# Patient Record
Sex: Female | Born: 1985 | Race: Black or African American | Hispanic: No | Marital: Single
Health system: Southern US, Community
[De-identification: ages and names within clinical notes are randomized; demographics above are authoritative.]

## PROBLEM LIST (undated history)

## (undated) DIAGNOSIS — Z789 Other specified health status: Secondary | ICD-10-CM

## (undated) HISTORY — PX: NO PAST SURGERIES: SHX2092

---

## 2010-07-25 ENCOUNTER — Emergency Department (HOSPITAL_COMMUNITY): Admission: EM | Admit: 2010-07-25 | Discharge: 2010-07-25 | Payer: Self-pay | Admitting: Emergency Medicine

## 2010-11-27 LAB — URINALYSIS, ROUTINE W REFLEX MICROSCOPIC
Bilirubin Urine: NEGATIVE
Nitrite: NEGATIVE
Specific Gravity, Urine: 1.025 (ref 1.005–1.030)
Urobilinogen, UA: 0.2 mg/dL (ref 0.0–1.0)

## 2010-11-27 LAB — URINE MICROSCOPIC-ADD ON

## 2010-11-27 LAB — PREGNANCY, URINE: Preg Test, Ur: NEGATIVE

## 2011-11-12 ENCOUNTER — Ambulatory Visit (INDEPENDENT_AMBULATORY_CARE_PROVIDER_SITE_OTHER): Payer: Private Health Insurance - Indemnity | Admitting: Obstetrics and Gynecology

## 2011-11-12 DIAGNOSIS — Z01419 Encounter for gynecological examination (general) (routine) without abnormal findings: Secondary | ICD-10-CM

## 2012-10-07 ENCOUNTER — Other Ambulatory Visit: Payer: Self-pay

## 2012-10-07 MED ORDER — NORETHINDRONE ACET-ETHINYL EST 1.5-30 MG-MCG PO TABS: 1.0000 | ORAL_TABLET | Freq: Every day | ORAL | Status: AC

## 2018-07-22 LAB — OB RESULTS CONSOLE ABO/RH: RH Type: NEGATIVE

## 2018-07-22 LAB — OB RESULTS CONSOLE RUBELLA ANTIBODY, IGM: Rubella: IMMUNE

## 2018-07-22 LAB — OB RESULTS CONSOLE RPR: RPR: NONREACTIVE

## 2018-07-22 LAB — OB RESULTS CONSOLE GC/CHLAMYDIA
Chlamydia: NEGATIVE
Gonorrhea: NEGATIVE

## 2018-07-22 LAB — OB RESULTS CONSOLE HEPATITIS B SURFACE ANTIGEN: Hepatitis B Surface Ag: NEGATIVE

## 2018-07-22 LAB — OB RESULTS CONSOLE HIV ANTIBODY (ROUTINE TESTING): HIV: NONREACTIVE

## 2018-09-16 NOTE — L&D Delivery Note (Signed)
Delivery Note At 12:49 PM a viable and healthy female was delivered via Vaginal, Spontaneous (Presentation: LOA ).  APGAR: 9, 9; weight 4 lb 9.7 oz (2090 g).   Placenta status: spontaneous, intact.  Cord:  with the following complications: none.  Cord pH: na  Anesthesia:  epidural Episiotomy: None Lacerations: 2nd degree;Perineal Suture Repair: 3.0 vicryl rapide Est. Blood Loss (mL): 182  Mom to postpartum.  Baby to Couplet care / Skin to Skin and then to NICU due to GA.  Darrien Laakso J 01/15/2019, 1:58 PM

## 2018-12-28 ENCOUNTER — Inpatient Hospital Stay (HOSPITAL_COMMUNITY): Payer: 59

## 2018-12-28 ENCOUNTER — Other Ambulatory Visit: Payer: Self-pay

## 2018-12-28 ENCOUNTER — Encounter (HOSPITAL_COMMUNITY): Payer: Self-pay

## 2018-12-28 ENCOUNTER — Inpatient Hospital Stay (HOSPITAL_COMMUNITY)
Admission: AD | Admit: 2018-12-28 | Discharge: 2019-01-17 | DRG: 807 | Disposition: A | Discharge status: Home / Self Care | Payer: 59 | Attending: Obstetrics and Gynecology | Admitting: Obstetrics and Gynecology

## 2018-12-28 DIAGNOSIS — O42919 Preterm premature rupture of membranes, unspecified as to length of time between rupture and onset of labor, unspecified trimester: Secondary | ICD-10-CM

## 2018-12-28 DIAGNOSIS — Z3A31 31 weeks gestation of pregnancy: Secondary | ICD-10-CM

## 2018-12-28 DIAGNOSIS — Z3A33 33 weeks gestation of pregnancy: Secondary | ICD-10-CM | POA: Diagnosis not present

## 2018-12-28 DIAGNOSIS — O42913 Preterm premature rupture of membranes, unspecified as to length of time between rupture and onset of labor, third trimester: Secondary | ICD-10-CM

## 2018-12-28 DIAGNOSIS — O4290 Premature rupture of membranes, unspecified as to length of time between rupture and onset of labor, unspecified weeks of gestation: Secondary | ICD-10-CM | POA: Diagnosis not present

## 2018-12-28 DIAGNOSIS — Z363 Encounter for antenatal screening for malformations: Secondary | ICD-10-CM

## 2018-12-28 DIAGNOSIS — Z3A32 32 weeks gestation of pregnancy: Secondary | ICD-10-CM | POA: Diagnosis not present

## 2018-12-28 HISTORY — DX: Other specified health status: Z78.9

## 2018-12-28 LAB — CBC
HCT: 35.5 % — ABNORMAL LOW (ref 36.0–46.0)
Hemoglobin: 11.6 g/dL — ABNORMAL LOW (ref 12.0–15.0)
MCH: 28.5 pg (ref 26.0–34.0)
MCHC: 32.7 g/dL (ref 30.0–36.0)
MCV: 87.2 fL (ref 80.0–100.0)
Platelets: 169 10*3/uL (ref 150–400)
RBC: 4.07 MIL/uL (ref 3.87–5.11)
RDW: 13.2 % (ref 11.5–15.5)
WBC: 12.1 10*3/uL — ABNORMAL HIGH (ref 4.0–10.5)
nRBC: 0 % (ref 0.0–0.2)

## 2018-12-28 LAB — URINALYSIS, ROUTINE W REFLEX MICROSCOPIC
Bilirubin Urine: NEGATIVE
Glucose, UA: NEGATIVE mg/dL
Hgb urine dipstick: NEGATIVE
Ketones, ur: NEGATIVE mg/dL
Nitrite: NEGATIVE
Protein, ur: NEGATIVE mg/dL
Specific Gravity, Urine: 1.002 — ABNORMAL LOW (ref 1.005–1.030)
pH: 7 (ref 5.0–8.0)

## 2018-12-28 LAB — TYPE AND SCREEN
ABO/RH(D): O POS
Antibody Screen: NEGATIVE

## 2018-12-28 LAB — RPR: RPR Ser Ql: NONREACTIVE

## 2018-12-28 LAB — POCT FERN TEST: POCT Fern Test: POSITIVE

## 2018-12-28 LAB — OB RESULTS CONSOLE GBS: GBS: NEGATIVE

## 2018-12-28 LAB — ABO/RH: ABO/RH(D): O POS

## 2018-12-28 MED ORDER — ZOLPIDEM TARTRATE 5 MG PO TABS
5.0000 mg | ORAL_TABLET | Freq: Every evening | ORAL | Status: DC | PRN
  Administered 2019-01-02 – 2019-01-13 (×4): 5 mg via ORAL
  Filled 2018-12-28 (×4): qty 1

## 2018-12-28 MED ORDER — DOCUSATE SODIUM 100 MG PO CAPS
100.0000 mg | ORAL_CAPSULE | Freq: Every day | ORAL | Status: DC
  Administered 2018-12-30 – 2019-01-14 (×16): 100 mg via ORAL
  Filled 2018-12-28 (×16): qty 1

## 2018-12-28 MED ORDER — MAGNESIUM SULFATE BOLUS VIA INFUSION
4.0000 g | Freq: Once | INTRAVENOUS | Status: AC
  Administered 2018-12-28: 04:00:00 4 g via INTRAVENOUS
  Filled 2018-12-28: qty 500

## 2018-12-28 MED ORDER — MAGNESIUM SULFATE 40 G IN LACTATED RINGERS - SIMPLE
1.0000 g/h | INTRAVENOUS | Status: AC
  Administered 2018-12-28: 1 g/h via INTRAVENOUS
  Filled 2018-12-28: qty 500

## 2018-12-28 MED ORDER — AMOXICILLIN 500 MG PO CAPS
500.0000 mg | ORAL_CAPSULE | Freq: Three times a day (TID) | ORAL | Status: AC
  Administered 2018-12-30 – 2019-01-03 (×15): 500 mg via ORAL
  Filled 2018-12-28 (×15): qty 1

## 2018-12-28 MED ORDER — BETAMETHASONE SOD PHOS & ACET 6 (3-3) MG/ML IJ SUSP
12.0000 mg | INTRAMUSCULAR | Status: AC
  Administered 2018-12-28 – 2018-12-29 (×2): 12 mg via INTRAMUSCULAR
  Filled 2018-12-28 (×2): qty 2

## 2018-12-28 MED ORDER — SODIUM CHLORIDE 0.9 % IV SOLN
2.0000 g | Freq: Four times a day (QID) | INTRAVENOUS | Status: AC
  Administered 2018-12-28 – 2018-12-29 (×8): 2 g via INTRAVENOUS
  Filled 2018-12-28 (×8): qty 2000

## 2018-12-28 MED ORDER — LACTATED RINGERS IV SOLN
INTRAVENOUS | Status: DC
  Administered 2018-12-28 – 2019-01-15 (×5): via INTRAVENOUS

## 2018-12-28 MED ORDER — AZITHROMYCIN 250 MG PO TABS
500.0000 mg | ORAL_TABLET | Freq: Every day | ORAL | Status: AC
  Administered 2018-12-30 – 2019-01-03 (×5): 500 mg via ORAL
  Filled 2018-12-28 (×6): qty 2

## 2018-12-28 MED ORDER — CALCIUM CARBONATE ANTACID 500 MG PO CHEW
2.0000 | CHEWABLE_TABLET | ORAL | Status: DC | PRN
  Administered 2018-12-30 – 2019-01-13 (×3): 400 mg via ORAL
  Filled 2018-12-28 (×3): qty 2

## 2018-12-28 MED ORDER — PRENATAL MULTIVITAMIN CH
1.0000 | ORAL_TABLET | Freq: Every day | ORAL | Status: DC
  Administered 2018-12-28 – 2019-01-14 (×18): 1 via ORAL
  Filled 2018-12-28 (×18): qty 1

## 2018-12-28 MED ORDER — SODIUM CHLORIDE 0.9 % IV SOLN
500.0000 mg | INTRAVENOUS | Status: AC
  Administered 2018-12-28 – 2018-12-29 (×2): 500 mg via INTRAVENOUS
  Filled 2018-12-28 (×2): qty 500

## 2018-12-28 MED ORDER — ACETAMINOPHEN 325 MG PO TABS: 650.0000 mg | ORAL_TABLET | ORAL | Status: DC | PRN

## 2018-12-28 MED ORDER — ENOXAPARIN SODIUM 40 MG/0.4ML ~~LOC~~ SOLN
40.0000 mg | SUBCUTANEOUS | Status: DC
  Administered 2018-12-28 – 2018-12-29 (×2): 40 mg via SUBCUTANEOUS
  Filled 2018-12-28 (×2): qty 0.4

## 2018-12-28 NOTE — MAU Provider Note (Signed)
  History     CSN: [id number]  Arrival date and time: 12/28/18 0018   None     Chief Complaint  Patient presents with  . Rupture of Membranes   Courtney Palmer presents for gush of fluid  She reports a clear gush of fluid around 11pm Fluid has continued to run down her legs since then Her pregnancy thus far has been uncomplicated She has not started to have contractions since the gush Denies abdominal pain or fever    Past Medical History:  Diagnosis Date  . Medical history non-contributory     Past Surgical History:  Procedure Laterality Date  . NO PAST SURGERIES      No family history on file.  Social History   Tobacco Use  . Smoking status: Not on file  Substance Use Topics  . Alcohol use: Never    Frequency: Never  . Drug use: Never    Allergies:  Allergies  Allergen Reactions  . Penicillins Rash    Medications Prior to Admission  Medication Sig Dispense Refill Last Dose  . Prenatal Vit-Fe Fumarate-FA (MULTIVITAMIN-PRENATAL) 27-0.8 MG TABS tablet Take 1 tablet by mouth daily at 12 noon.   Past Week at Unknown time    Review of Systems  All other systems reviewed and are negative.  Physical Exam   Blood pressure 134/74, pulse 98, temperature 98.5 F (36.9 C), temperature source Oral, resp. rate 18, height 5\' 5"  (1.651 m), weight 90.2 kg.  Physical Exam  Constitutional: She is oriented to person, place, and time. She appears well-developed and well-nourished. No distress.  HENT:  Head: Normocephalic and atraumatic.  Eyes: Pupils are equal, round, and reactive to light. Conjunctivae and EOM are normal. Right eye exhibits no discharge. Left eye exhibits no discharge. No scleral icterus.  Cardiovascular: Normal rate and regular rhythm.  Respiratory: Effort normal. No respiratory distress.  Genitourinary:    Genitourinary Comments: Grossly ruptured on exam; clear fluid Cervical exam FT/thick/-3; unable to determine presentation   Neurological: She is  alert and oriented to person, place, and time.  Skin: She is not diaphoretic.  Psychiatric: She has a normal mood and affect. Her behavior is normal. Judgment and thought content normal.  FHT: baseline 140/mod variability/+ acels/no decels Uterine activity: none  MAU Course  Procedures  MDM Fern positive with gross rupture  Not laboring  Assessment and Plan  Preterm premature rupture of membranes (PPROM) with unknown onset of labor -   - Admit to inpatient - [place] MFM OB COMP + 14 WK - BMZ x 2 - Magnesium 4g load + 1g/hr x 24 hours - GBS swab - latency abx; azithromycin ordered - routine Ob admission labs - plan discussed with Dr. Billy Coast and patient who are both agreeable   Gwenevere Abbot 12/28/2018, 1:45 AM

## 2018-12-28 NOTE — Progress Notes (Signed)
To USS per wheelchair.

## 2018-12-28 NOTE — Progress Notes (Signed)
Patient had c/o decreased fetal movement.  Patient was placed on the monitor and we have a category 1 tracing.

## 2018-12-28 NOTE — Progress Notes (Signed)
Spoke with Dr Aneta Mins in NICU and he will come see patient this afternoon for a consult.

## 2018-12-28 NOTE — MAU Note (Signed)
Pt states about 1 hour prior to arrival she had a gush of clear fluid that has continued to trickle out. Pt denies vaginal bleeding. Denies contractions but reports some mild lower back pain. Reports good fetal movement today.

## 2018-12-28 NOTE — MAU Provider Note (Addendum)
  History     HISTORY AND PHYSICAL EXAM  CSN: [id number]  Arrival date and time: 12/28/18 0018   None     Chief Complaint  Patient presents with  . Rupture of Membranes    She reports a clear gush of fluid around 11pm Fluid has continued to leak since the initial gush Denies contractions or bleeding. Good FM Denies abdominal pain or fever. Uncomplicated prenatal care to date   Past Medical History:  Diagnosis Date  . Medical history non-contributory     Past Surgical History:  Procedure Laterality Date  . NO PAST SURGERIES      No family history on file.  Social History   Tobacco Use  . Smoking status: Not on file  Substance Use Topics  . Alcohol use: Never    Frequency: Never  . Drug use: Never    Allergies:  Allergies  Allergen Reactions  . Penicillins Rash    Medications Prior to Admission  Medication Sig Dispense Refill Last Dose  . Prenatal Vit-Fe Fumarate-FA (MULTIVITAMIN-PRENATAL) 27-0.8 MG TABS tablet Take 1 tablet by mouth daily at 12 noon.   Past Week at Unknown time    Review of Systems  All other systems reviewed and are negative.  Physical Exam   Blood pressure 104/64, pulse 99, temperature 98.2 F (36.8 C), temperature source Oral, resp. rate 17, height 5\' 5"  (1.651 m), weight 90.2 kg, SpO2 97 %.  Physical Exam  Constitutional: She is oriented to person, place, and time. She appears well-developed and well-nourished. No distress.  HENT:  Head: Normocephalic and atraumatic.  Neck: Normal range of motion. Neck supple.  Cardiovascular: Normal rate, regular rhythm and normal heart sounds.  Respiratory: Effort normal and breath sounds normal. No respiratory distress.  GI: Soft. Bowel sounds are normal.  Genitourinary:    Vagina and uterus normal.     Genitourinary Comments: Grossly ruptured on exam; clear fluid Cervical exam FT/thick/-3; unable to determine presentation   Musculoskeletal: Normal range of motion.  Neurological: She is  alert and oriented to person, place, and time.  Skin: Skin is warm and dry. She is not diaphoretic.  Psychiatric: She has a normal mood and affect. Her behavior is normal.   FHT: baseline 140/mod variability/+ acels/no decels Uterine activity: none Category 1 tracing  MAU Course  Procedures  MDM CBC    Component Value Date/Time   WBC 12.1 (H) 12/28/2018 0154   RBC 4.07 12/28/2018 0154   HGB 11.6 (L) 12/28/2018 0154   HCT 35.5 (L) 12/28/2018 0154   PLT 169 12/28/2018 0154   MCV 87.2 12/28/2018 0154   MCH 28.5 12/28/2018 0154   MCHC 32.7 12/28/2018 0154   RDW 13.2 12/28/2018 0154     Assessment and Plan  Preterm premature rupture of membranes (PPROM) with no evidence of PTL or Chorio - Admit to inpatient - [place] MFM OB COMP + 14 WK - BMZ series - Magnesium neuroprophylaxis - GBS pending - latency abx; azithromycin / ampicillincordered - Ob admission labs - Monitor s/s chorio -NICU consult -Continuous EFM and TOCO  Zayne Draheim J 12/28/2018, 7:14 AM

## 2018-12-28 NOTE — Consult Note (Signed)
Neonatology Consult to Antenatal Patient:  I was asked by Dr. Macon Large to see this patient in order to provide antenatal counseling due to prematurity.  Courtney Palmer was admitted 4/13 at 31 5/[redacted] weeks GA. She is currently ruptured without PTL.  Pregnancy otherwise uncomplicated she is getting BMZ, Magnesium and prophylactic antibiotics.  Reassuring fetal tracing.  I spoke with the patient and [name]. We discussed the worst case of delivery in the next 1-2 days, including usual DR management, possible respiratory complications and need for support, IV access, feedings (mother desires breast feeding, which was encouraged), LOS, Mortality and Morbidity, and long term outcomes.  They did have any questions at this time which I answer as best I could.  They were made aware strict visiting guidelines may in be place at the time of his birth.   We would be glad to come back if they have more questions later.  Thank you for asking me to see this .  Dineen Kid Leary Roca, MD Neonatologist  The total length of face-to-face or floor/unit time for this encounter was 25 minutes. Counseling and/or coordination of care was 40 minutes of the above.

## 2018-12-29 ENCOUNTER — Encounter (HOSPITAL_COMMUNITY): Payer: Self-pay | Admitting: *Deleted

## 2018-12-29 MED ORDER — DIPHENHYDRAMINE HCL 25 MG PO CAPS
25.0000 mg | ORAL_CAPSULE | Freq: Four times a day (QID) | ORAL | Status: DC | PRN
  Administered 2018-12-29 – 2018-12-30 (×2): 25 mg via ORAL
  Filled 2018-12-29 (×2): qty 1

## 2018-12-29 NOTE — Progress Notes (Addendum)
Patient ID: Courtney Palmer, female   DOB: 01-14-1986, 33 y.o.   MRN: [id number] HD#[id number] 31w 6d PPROM  SP Good FM. Continued LOF. No bleeding. Rare contractions. NO chills, No CP or SOB.  O: BP (!) 117/53 (BP Location: Right Arm)   Pulse 88   Temp 98.2 F (36.8 C) (Oral)   Resp 17   Ht 5\' 5"  (1.651 m)   Wt 90.2 kg   SpO2 97%   BMI 33.08 kg/m   NCAT HEENT: Nl Neck: supple with FROM Lungs: CTA CV: RRR ABD: gravid , NT No CVAT Pelvic: deferred EXT: neg c/c/e, SCDs intact Neuro: nonfoca Skin: intact  CBC    Component Value Date/Time   WBC 12.1 (H) 12/28/2018 0154   RBC 4.07 12/28/2018 0154   HGB 11.6 (L) 12/28/2018 0154   HCT 35.5 (L) 12/28/2018 0154   PLT 169 12/28/2018 0154   MCV 87.2 12/28/2018 0154   MCH 28.5 12/28/2018 0154   MCHC 32.7 12/28/2018 0154   RDW 13.2 12/28/2018 0154    Sono 4/13 Cephalic presentation, AFI 3, EFW 1700 gms  FHR 130-140s, 15x15accels, no decels, rare contractions Category 1 tracing x 3.  A: PPROM [redacted]w[redacted]d w IUP No s/s chorio  P: BMZ series complete Latency abx Completed MgSO4 neuroprophylaxis GBS pending NICU consult done NST TID Monitor s/s chorio Delivery timing discussed

## 2018-12-30 LAB — CULTURE, BETA STREP (GROUP B ONLY)

## 2018-12-30 NOTE — Progress Notes (Signed)
Patient ID: Courtney Palmer, female   DOB: 12/14/1985, 33 y.o.   MRN: [id number] HD#[id number] 32w 0d PPROM  SP Good FM. Continued LOF. No bleeding. Rare contractions. NO chills, No CP or SOB.  O: BP (!) 120/58 (BP Location: Left Arm)   Pulse 82   Temp 98.3 F (36.8 C) (Oral)   Resp 18   Ht 5\' 5"  (1.651 m)   Wt 90.2 kg   SpO2 100%   BMI 33.08 kg/m  TM 98.5  NCAT HEENT: Nl Neck: supple with FROM Lungs: CTA CV: RRR ABD: gravid , NT No CVAT Pelvic: deferred EXT: neg c/c/e, SCDs intact Neuro: nonfocal Skin: intact  CBC    Component Value Date/Time   WBC 12.1 (H) 12/28/2018 0154   RBC 4.07 12/28/2018 0154   HGB 11.6 (L) 12/28/2018 0154   HCT 35.5 (L) 12/28/2018 0154   PLT 169 12/28/2018 0154   MCV 87.2 12/28/2018 0154   MCH 28.5 12/28/2018 0154   MCHC 32.7 12/28/2018 0154   RDW 13.2 12/28/2018 0154    Sono 4/13 Cephalic presentation, AFI 3, EFW 1700 gms  FHR 130-140s, 15x15accels, no decels, rare contractions Category 1 tracing x 3. GBS neg  A: PPROM [redacted]w[redacted]d w IUP No s/s chorio  P: BMZ series complete Latency abx Completed MgSO4 neuroprophylaxis NICU consult done NST TID Monitor s/s chorio Inpatient until delivery

## 2018-12-31 NOTE — Progress Notes (Addendum)
Patient ID: Courtney Palmer, female   DOB: [birth year], 33 y.o.   MRN: [id number] HD#[id number] 32w 1d PPROM  SP Good FM. Continued LOF. No bleeding. Rare contractions. NO chills, No CP or SOB.  O: BP 119/75 (BP Location: Left Arm)   Pulse 84   Temp 98.4 F (36.9 C) (Oral)   Resp 18   Ht 5\' 5"  (1.651 m)   Wt 90.2 kg   SpO2 98%   BMI 33.08 kg/m  TM 99.0  NCAT HEENT: Nl Neck: supple with FROM Lungs: CTA CV: RRR ABD: gravid , NT No CVAT Pelvic: deferred EXT: neg c/c/e, SCDs intact Neuro: nonfocal Skin: intact  CBC    Component Value Date/Time   WBC 12.1 (H) 12/28/2018 0154   RBC 4.07 12/28/2018 0154   HGB 11.6 (L) 12/28/2018 0154   HCT 35.5 (L) 12/28/2018 0154   PLT 169 12/28/2018 0154   MCV 87.2 12/28/2018 0154   MCH 28.5 12/28/2018 0154   MCHC 32.7 12/28/2018 0154   RDW 13.2 12/28/2018 0154    Sono 4/13 Cephalic presentation, AFI 3, EFW 1700 gms  FHR 130-140s, 15x15accels, no decels, rare contractions Category 1 tracing x 3. GBS neg  A: PPROM [redacted]w[redacted]d w IUP No s/s chorio  P: BMZ series complete Latency abx Completed MgSO4 neuroprophylaxis NICU consult done NST TID Monitor s/s chorio Inpatient until delivery

## 2019-01-01 LAB — TYPE AND SCREEN
ABO/RH(D): O POS
Antibody Screen: NEGATIVE

## 2019-01-01 NOTE — Progress Notes (Addendum)
Hd #5 32.2 wga PPROM  Denies ctx/vb; still with light LOF; had mild cramps earlier but resolved - thinks may have been bowel related; no other c/o; +FM  Temp:  [98.3 F (36.8 C)-98.7 F (37.1 C)] 98.4 F (36.9 C) (04/17 1225) Pulse Rate:  [75-100] 97 (04/17 1225) Resp:  [16-19] 16 (04/17 1225) BP: (98-123)/(48-70) 116/64 (04/17 1225) SpO2:  [98 %-100 %] 99 % (04/17 1225)  A&ox3 rrr ctab Abd: soft, nt, gravid LE: no edema, nt bilat; +scds  Fht: 150s, nml variability, +accels, no decels, couple variables last night, none further Toco: no ctx  U/s: cephalic, afi 3cm, efw 1700g (3/38/25)   a/P: iup at 32.2 1. pprom - stable, no s/s chorio, contin latency abx and monitor for s/s chorio 2. Fetal status reassuring; contin nst tid 3.prematurity - s/p bmz, s/p magsulfate for neuroprotection; s/p nicu consult 4. Inpatient until delivery 5. gbs neg

## 2019-01-02 NOTE — Progress Notes (Signed)
Hd 6 32.3 wga pprom  Light LOF, denies vb, ctx, +fm; no c/o and feels good   Temp:  [98.4 F (36.9 C)-98.7 F (37.1 C)] 98.4 F (36.9 C) (04/18 1118) Pulse Rate:  [87-100] 87 (04/18 1118) Resp:  [16-18] 18 (04/18 1118) BP: (107-116)/(60-66) 115/66 (04/18 1118) SpO2:  [96 %-100 %] 100 % (04/18 1118)  A&ox3 rrr ctab Abd: soft, nt, gravid LE: no edema, nt bilat; scds in place  nstx3: Fht: 130s-140s baseline, nml variability, +accels, no decels Toco: no ctx  U/s: cephalic, afi 3cm, efw 1700g (12/24/28)  a/P: iup at 32.3 1. pprom - stable, no s/s chorio, contin latency abx and monitor for s/s chorio 2. Fetal status reassuring; contin nst tid 3.prematurity - s/p bmz, s/p magsulfate for neuroprotection; s/p nicu consult 4. Inpatient until delivery 5. gbs neg

## 2019-01-03 NOTE — Progress Notes (Signed)
Hd #7 32.4 wga PPROM  Continued light lof, denies vaginal bleeding, ctx; +fm No sob/cp or other c/o  Temp:  [98.1 F (36.7 C)-98.6 F (37 C)] 98.1 F (36.7 C) (04/19 0800) Pulse Rate:  [87-110] 110 (04/19 0800) Resp:  [18] 18 (04/19 0800) BP: (112-126)/(60-73) 112/73 (04/19 0800) SpO2:  [95 %-100 %] 95 % (04/19 0800)  A&ox3 rrr ctab Abd: soft, nt, gravid Le: no edema, nt bilat; scds placed  nstx3: Fht: 140 baseline, nml variability, +accels, no decels Toco: no ctx  U/s: cephalic, afi 3cm, efw 1700g (5/63/14)  a/P: iup at 32.4 1. pprom - stable, no s/s chorio, contin latency abx and monitor for s/s chorio; encourage scds 2. Fetal status reassuring; contin nst tid 3.prematurity - s/p bmz, s/p magsulfate for neuroprotection; s/p nicu consult 4. Inpatient until delivery 5. gbs neg

## 2019-01-03 NOTE — Progress Notes (Addendum)
RN called to Pt room. Pt was tearful and expressing that she felt anxious and that she has not been sleeping well. Diversional activities offered, as well as Chaplain. Pt declined all.

## 2019-01-04 ENCOUNTER — Inpatient Hospital Stay (HOSPITAL_COMMUNITY): Payer: 59

## 2019-01-04 ENCOUNTER — Encounter (HOSPITAL_COMMUNITY): Payer: Self-pay

## 2019-01-04 DIAGNOSIS — O42913 Preterm premature rupture of membranes, unspecified as to length of time between rupture and onset of labor, third trimester: Secondary | ICD-10-CM

## 2019-01-04 DIAGNOSIS — Z3A32 32 weeks gestation of pregnancy: Secondary | ICD-10-CM

## 2019-01-04 LAB — TYPE AND SCREEN
ABO/RH(D): O POS
Antibody Screen: NEGATIVE

## 2019-01-04 LAB — HEPATITIS B SURFACE ANTIGEN: Hepatitis B Surface Ag: NEGATIVE

## 2019-01-04 LAB — HIV ANTIBODY (ROUTINE TESTING W REFLEX): HIV Screen 4th Generation wRfx: NONREACTIVE

## 2019-01-04 MED ORDER — LORATADINE 10 MG PO TABS
10.0000 mg | ORAL_TABLET | Freq: Every day | ORAL | Status: DC
  Administered 2019-01-04 – 2019-01-14 (×11): 10 mg via ORAL
  Filled 2019-01-04 (×12): qty 1

## 2019-01-04 MED ORDER — SODIUM CHLORIDE 0.9% FLUSH
3.0000 mL | Freq: Two times a day (BID) | INTRAVENOUS | Status: DC
  Administered 2019-01-04 – 2019-01-14 (×18): 3 mL via INTRAVENOUS

## 2019-01-04 MED ORDER — PROMETHAZINE HCL 25 MG PO TABS
25.0000 mg | ORAL_TABLET | Freq: Three times a day (TID) | ORAL | Status: DC | PRN
  Administered 2019-01-04: 25 mg via ORAL
  Filled 2019-01-04 (×2): qty 1

## 2019-01-04 NOTE — Plan of Care (Signed)
  Problem: Pain Managment: Goal: General experience of comfort will improve Outcome: Progressing   

## 2019-01-04 NOTE — Progress Notes (Addendum)
Hd # 8 32.5 wga PPROM  Continued light lof, denies vaginal bleeding, ctx; +fm No sob/cp. Anxious, cant sleep well, took Ambien last night and slept for 1 hr Some nausea this morning. May be seasonal allergies with head stuffiness, took Benadryl few ds back, not tried Claritin  Temp:  [98.2 F (36.8 C)-98.4 F (36.9 C)] 98.2 F (36.8 C) (04/20 0840) Pulse Rate:  [95-101] 97 (04/20 0840) Resp:  [17-19] 18 (04/20 0840) BP: (104-127)/(61-73) 104/61 (04/20 0840) SpO2:  [98 %-100 %] 99 % (04/20 0840)  A&ox3 rrr ctab Abd: soft, nt, gravid. Non tender relaxed, soft uterus  Le: no edema, nt bilat; scds placed when resting, no calf tenderness  nstx3: Fht: 140 baseline, nml variability, +accels, no decels Toco: no ctx  U/s: cephalic, afi 3cm, efw 1700g (03/27/44)  a/P: iup at 32.5 1. pprom - stable, no s/s chorio, latency abx 7 days and monitor for s/s chorio; continue scds 2. Fetal status reassuring; contin nst tid. Weekly BPP per MFM on 4/13 sono, will add today 3. prematurity - s/p bmz, s/p magsulfate for neuroprotection; s/p nicu consult 4. Inpatient until delivery, likely at 34 wks, but d/w Dr Billy Coast 5. gbs neg 6. Nausea- adding Phenergan and will add Pepcid if not better 7. Seasonal allergy- adding Claritin

## 2019-01-05 LAB — RUBELLA SCREEN: Rubella: 4.27 index (ref >0.99)

## 2019-01-05 NOTE — Progress Notes (Signed)
Initial Nutrition Assessment  DOCUMENTATION CODES:  Not applicable  INTERVENTION:  Regular diet May order snacks TID and double protein portions upon request   NUTRITION DIAGNOSIS:  Increased nutrient needs related to (pregnancy and fetal growth requirements) as evidenced by (32 weeks IUP). GOAL:  Patient will meet greater than or equal to 90% of their needs MONITOR:  Weight trends  REASON FOR ASSESSMENT:  Antenatal   ASSESSMENT:  32 6 weeks IUP, PROM. no PNR available in EPIC .Wt on 04/07/18 173 lbs. BMI 28.9. wt gain of 25 lbs   Diet Order:   Diet Order            Diet regular Room service appropriate? Yes; Fluid consistency: Thin  Diet effective now              EDUCATION NEEDS: none identified  Skin:  Skin Assessment: Reviewed RN Assessment  Height:   Ht Readings from Last 1 Encounters:  12/28/18 5\' 5"  (1.651 m)    Weight:   Wt Readings from Last 1 Encounters:  12/28/18 90.2 kg    Ideal Body Weight:   125 lbs  BMI:  Body mass index is 33.08 kg/m.  Estimated Nutritional Needs:   Kcal:  2000-2200  Protein:  85-95 g  Fluid:  2.3 L    Elisabeth Cara M.Odis Luster LDN Neonatal Nutrition Support Specialist/RD III Pager [phone]      Phone [phone]

## 2019-01-05 NOTE — Progress Notes (Addendum)
Patient ID: Courtney Palmer, female   DOB: Jan 02, 1986, 33 y.o.   MRN: [id number] HD#[id number] 32w 6d PPROM  SP Good FM. Continued LOF. No bleeding. Rare contractions. NO chills, No CP or SOB.  O: BP 116/64 (BP Location: Left Arm)   Pulse 96   Temp 98 F (36.7 C) (Oral)   Resp 17   Ht 5\' 5"  (1.651 m)   Wt 90.2 kg   SpO2 98%   BMI 33.08 kg/m  TM 99.0  NCAT HEENT: Nl Neck: supple with FROM Lungs: CTA CV: RRR ABD: gravid , NT No CVAT Pelvic: deferred EXT: neg c/c/e, SCDs intact Neuro: nonfocal Skin: intact  CBC    Component Value Date/Time   WBC 12.1 (H) 12/28/2018 0154   RBC 4.07 12/28/2018 0154   HGB 11.6 (L) 12/28/2018 0154   HCT 35.5 (L) 12/28/2018 0154   PLT 169 12/28/2018 0154   MCV 87.2 12/28/2018 0154   MCH 28.5 12/28/2018 0154   MCHC 32.7 12/28/2018 0154   RDW 13.2 12/28/2018 0154    Sono 4/13 Cephalic presentation, AFI 3, EFW 1700 gms  BPP 6/8, AFI 4.26 4/20  FHR 130-140s, 15x15accels, no decels, rare contractions Category 1 tracing x 3. GBS neg  A: PPROM [redacted]w[redacted]d w IUP No s/s chorio  P: BMZ series complete Latency abx Completed MgSO4 neuroprophylaxis NICU consult done NST TID Monitor s/s chorio Inpatient until delivery

## 2019-01-06 NOTE — Progress Notes (Signed)
Call from RN at 1736, and I was speaking with a patient that called our after hours service. RTC to nurse at 1740: Expresses concern for decreased variability and 10x10 accelerations. States she had moderate variability and +15x15accels with monitoring yesterday. Reports she has had 1 cup of water and is now on her left side. She has no hx of HTN and no edema. She does have a SL IV. Discussed IVF bolus, but as I looked at strip, variability improving to moderate, and one 15x15 acceleration noted.  No decelerations noted. Advised to continue prolonged monitoring until at least 2 15x15 accels noted with continued moderate variability.  Notify me or Dr. Billy Coast if continued concerns for FHR.    Carlean Jews, CNM

## 2019-01-06 NOTE — Progress Notes (Signed)
Patient ID: Courtney Palmer, female   DOB: [birth year], 33 y.o.   MRN: [id number] HD#[id number] 33w 0d PPROM  SP Good FM. Continued LOF. No bleeding. Rare contractions. NO chills, No CP or SOB. Less emotional today  O: BP 112/64 (BP Location: Left Arm)   Pulse 96   Temp 98.2 F (36.8 C) (Oral)   Resp 18   Ht 5\' 5"  (1.651 m)   Wt 90.2 kg   SpO2 100%   BMI 33.08 kg/m  TM 99.0  NCAT HEENT: Nl Neck: supple with FROM Lungs: CTA CV: RRR ABD: gravid , NT No CVAT Pelvic: deferred EXT: neg c/c/e, SCDs intact Neuro: nonfocal Skin: intact  CBC    Component Value Date/Time   WBC 12.1 (H) 12/28/2018 0154   RBC 4.07 12/28/2018 0154   HGB 11.6 (L) 12/28/2018 0154   HCT 35.5 (L) 12/28/2018 0154   PLT 169 12/28/2018 0154   MCV 87.2 12/28/2018 0154   MCH 28.5 12/28/2018 0154   MCHC 32.7 12/28/2018 0154   RDW 13.2 12/28/2018 0154    Sono 4/13 Cephalic presentation, AFI 3, EFW 1700 gms  BPP 6/8, AFI 4.26 4/20  FHR 130-140s, 15x15accels, no decels, rare contractions Category 1 tracing x 3. GBS neg  A: PPROM [redacted]w[redacted]d w IUP No s/s chorio  P: BMZ series complete Latency abx Completed MgSO4 neuroprophylaxis NICU consult done NST TID Monitor s/s chorio Inpatient until delivery

## 2019-01-07 ENCOUNTER — Encounter (HOSPITAL_COMMUNITY): Payer: Self-pay | Admitting: *Deleted

## 2019-01-07 NOTE — Progress Notes (Signed)
I offered spiritual care services to [place].  She was in good spirits and appears to be coping well and has good support from her boyfriend and her mother. She stated that she had a more difficult time coping earlier in the week but has come to a better mind set at this time.  Chaplain Dyanne Carrel, Bcc Pager, [phone] 11:00 AM    01/07/19 1000  Clinical Encounter Type  Visited With Patient  Visit Type Initial

## 2019-01-07 NOTE — Progress Notes (Signed)
Pt refusing type and screen at the moment. RN attempted to answer questions regarding the importance of type and screens. Pt would like to talk to the doctor before allowing another type and screen to be drawn. Lab tech notified.  Arva Chafe, RN

## 2019-01-07 NOTE — Progress Notes (Signed)
Patient ID: Courtney Palmer, female   DOB: 1986-05-15, 33 y.o.   MRN: [id number] HD#[id number] 33w 1d PPROM  SP Good FM. Continued LOF. No bleeding. Rare contractions. NO chills, No CP or SOB. Less emotional today  O: BP 119/86 (BP Location: Right Arm)   Pulse (!) 103   Temp 97.9 F (36.6 C) (Oral)   Resp 18   Ht 5\' 5"  (1.651 m)   Wt 90.2 kg   SpO2 97%   BMI 33.08 kg/m  TM 99.0  NCAT HEENT: Nl Neck: supple with FROM Lungs: CTA CV: RRR ABD: gravid , NT No CVAT Pelvic: deferred EXT: neg c/c/e, SCDs intact Neuro: nonfocal Skin: intact  CBC    Component Value Date/Time   WBC 12.1 (H) 12/28/2018 0154   RBC 4.07 12/28/2018 0154   HGB 11.6 (L) 12/28/2018 0154   HCT 35.5 (L) 12/28/2018 0154   PLT 169 12/28/2018 0154   MCV 87.2 12/28/2018 0154   MCH 28.5 12/28/2018 0154   MCHC 32.7 12/28/2018 0154   RDW 13.2 12/28/2018 0154    Sono 4/13 Cephalic presentation, AFI 3, EFW 1700 gms  BPP 6/8, AFI 4.26 4/20  FHR 130-140s, 15x15accels, no decels, rare contractions Category 1 tracing x 3. GBS neg  A: PPROM [redacted]w[redacted]d w IUP No s/s chorio  P: BMZ series complete Latency abx Completed MgSO4 neuroprophylaxis NICU consult done NST TID Monitor s/s chorio Inpatient until delivery

## 2019-01-08 NOTE — Progress Notes (Signed)
Patient ID: Courtney Palmer, female   DOB: 18-May-1986, 33 y.o.   MRN: [id number] HD#[id number] 33w 2d PPROM  SP Good FM. Continued LOF. No bleeding. Rare contractions. NO chills, No CP or SOB. Increased fluid leakage  O: BP 113/67 (BP Location: Left Arm)   Pulse 99   Temp 98.2 F (36.8 C) (Oral)   Resp 18   Ht 5\' 5"  (1.651 m)   Wt 90.2 kg   SpO2 98%   BMI 33.08 kg/m  TM 99.0  NCAT HEENT: Nl Neck: supple with FROM Lungs: CTA CV: RRR ABD: gravid , NT No CVAT Pelvic: deferred EXT: neg c/c/e, SCDs intact Neuro: nonfocal Skin: intact  CBC    Component Value Date/Time   WBC 12.1 (H) 12/28/2018 0154   RBC 4.07 12/28/2018 0154   HGB 11.6 (L) 12/28/2018 0154   HCT 35.5 (L) 12/28/2018 0154   PLT 169 12/28/2018 0154   MCV 87.2 12/28/2018 0154   MCH 28.5 12/28/2018 0154   MCHC 32.7 12/28/2018 0154   RDW 13.2 12/28/2018 0154    Sono 4/13 Cephalic presentation, AFI 3, EFW 1700 gms  BPP 6/8, AFI 4.26 4/20  FHR 130-140s, 15x15accels, no decels, rare contractions Category 1 tracing x 3. GBS neg  A: PPROM [redacted]w[redacted]d w IUP No s/s chorio  P: BMZ series complete Latency abx Completed MgSO4 neuroprophylaxis NICU consult done NST TID Monitor s/s chorio Inpatient until delivery

## 2019-01-09 NOTE — Progress Notes (Signed)
Patient ID: Courtney Palmer, female   DOB: 1985-10-13, 33 y.o.   MRN: [id number] HD#[id number] 33w 3d PPROM  SP Good FM. Continued LOF. No bleeding. Rare contractions. NO chills, No CP or SOB. Increased fluid leakage  O: BP 121/72 (BP Location: Left Arm)   Pulse 92   Temp 98.3 F (36.8 C) (Oral)   Resp 18   Ht 5\' 5"  (1.651 m)   Wt 90.2 kg   SpO2 99%   BMI 33.08 kg/m  TM 99.0  NCAT HEENT: Nl Neck: supple with FROM Lungs: CTA CV: RRR ABD: gravid , NT No CVAT Pelvic: deferred EXT: neg c/c/e, SCDs intact Neuro: nonfocal Skin: intact  CBC    Component Value Date/Time   WBC 12.1 (H) 12/28/2018 0154   RBC 4.07 12/28/2018 0154   HGB 11.6 (L) 12/28/2018 0154   HCT 35.5 (L) 12/28/2018 0154   PLT 169 12/28/2018 0154   MCV 87.2 12/28/2018 0154   MCH 28.5 12/28/2018 0154   MCHC 32.7 12/28/2018 0154   RDW 13.2 12/28/2018 0154    Sono 4/13 Cephalic presentation, AFI 3, EFW 1700 gms  BPP 6/8, AFI 4.26 4/20  FHR 130-140s, 15x15accels, no  Recurrent decels, isolated variable x one today.  rare contractions Category 1 tracing x 3. GBS neg  A: PPROM [redacted]w[redacted]d w IUP No s/s chorio  P: BMZ series complete Latency abx completed Completed MgSO4 neuroprophylaxis NICU consult done NST TID Monitor s/s chorio Inpatient until delivery Delivery timing- pt considering 35 wks. Will have NICU disuss risks /benefits of delaying from 34 to 35 wks. New ACOG guidelines discussed.

## 2019-01-10 NOTE — Progress Notes (Signed)
Patient ID: Courtney Palmer, female   DOB: 01-Jun-1986, 33 y.o.   MRN: [id number] HD#[id number] 33w 4d PPROM  SP Good FM. Continued LOF. No bleeding. Rare contractions. NO chills, No CP or SOB. Increased fluid leakage  O: BP 111/76 (BP Location: Left Arm)   Pulse (!) 109   Temp 98.4 F (36.9 C) (Oral)   Resp 18   Ht 5\' 5"  (1.651 m)   Wt 90.2 kg   SpO2 98%   BMI 33.08 kg/m  TM 98.4  NCAT HEENT: Nl Neck: supple with FROM Lungs: CTA CV: RRR ABD: gravid , NT No CVAT Pelvic: deferred EXT: neg c/c/e, SCDs intact Neuro: nonfocal Skin: intact  CBC    Component Value Date/Time   WBC 12.1 (H) 12/28/2018 0154   RBC 4.07 12/28/2018 0154   HGB 11.6 (L) 12/28/2018 0154   HCT 35.5 (L) 12/28/2018 0154   PLT 169 12/28/2018 0154   MCV 87.2 12/28/2018 0154   MCH 28.5 12/28/2018 0154   MCHC 32.7 12/28/2018 0154   RDW 13.2 12/28/2018 0154    Sono 4/13 Cephalic presentation, AFI 3, EFW 1700 gms  BPP 6/8, AFI 4.26 4/20  FHR 130-140s, 15x15accels, no  Recurrent decels, rare isolated variable,  rare contractions Category 1 tracing x 3. GBS neg  A: PPROM [redacted]w[redacted]d w IUP No s/s chorio  P: BMZ series complete Latency abx completed Completed MgSO4 neuroprophylaxis NICU consult done NST TID Monitor s/s chorio Inpatient until delivery Delivery timing- pt considering 35 wks. Will have NICU disuss risks /benefits of delaying from 34 to 35 wks. New ACOG guidelines discussed.

## 2019-01-11 ENCOUNTER — Inpatient Hospital Stay (HOSPITAL_COMMUNITY): Payer: 59

## 2019-01-11 DIAGNOSIS — O4290 Premature rupture of membranes, unspecified as to length of time between rupture and onset of labor, unspecified weeks of gestation: Secondary | ICD-10-CM

## 2019-01-11 DIAGNOSIS — Z3A33 33 weeks gestation of pregnancy: Secondary | ICD-10-CM

## 2019-01-11 NOTE — Progress Notes (Signed)
Patient ID: Courtney Palmer, female   DOB: 1986/04/02, 33 y.o.   MRN: [id number] HD#[id number] 33w 5d PPROM  SP Good FM. Continued LOF. No bleeding. Rare contractions. NO chills, No CP or SOB. Increased fluid leakage continues  O: BP 119/71   Pulse 81   Temp 98.1 F (36.7 C) (Oral)   Resp 17   Ht 5\' 5"  (1.651 m)   Wt 90.2 kg   SpO2 100%   BMI 33.08 kg/m  TM 98.1  NCAT HEENT: Nl Neck: supple with FROM Lungs: CTA CV: RRR ABD: gravid , NT No CVAT Pelvic: deferred EXT: neg c/c/e, SCDs intact Neuro: nonfocal Skin: intact  CBC    Component Value Date/Time   WBC 12.1 (H) 12/28/2018 0154   RBC 4.07 12/28/2018 0154   HGB 11.6 (L) 12/28/2018 0154   HCT 35.5 (L) 12/28/2018 0154   PLT 169 12/28/2018 0154   MCV 87.2 12/28/2018 0154   MCH 28.5 12/28/2018 0154   MCHC 32.7 12/28/2018 0154   RDW 13.2 12/28/2018 0154    Sono 4/13 Cephalic presentation, AFI 3, EFW 1700 gms  BPP 6/8, AFI 4.26 4/20  FHR 130-140s, 15x15accels, no  Recurrent decels, rare isolated variable,  rare contractions Category 1 tracing x 3. GBS neg  A: PPROM [redacted]w[redacted]d w IUP No s/s chorio  P: BPP today BMZ series complete Latency abx completed Completed MgSO4 neuroprophylaxis NICU consult done NST TID Monitor s/s chorio Inpatient until delivery Delivery timing- pt considering 35 wks. Will have NICU disuss risks /benefits of delaying from 34 to 35 wks. New ACOG guidelines discussed.

## 2019-01-12 NOTE — Progress Notes (Signed)
Patient ID: Courtney Palmer, female   DOB: Mar 23, 1986, 33 y.o.   MRN: [id number] HD#[id number] 33w 6d PPROM  SP Good FM. Continued LOF. No bleeding. Rare contractions. NO chills, No CP or SOB. Increased fluid leakage continues  O: BP (!) 105/59 (BP Location: Left Arm)   Pulse 96   Temp 98.5 F (36.9 C) (Oral)   Resp 18   Ht 5\' 5"  (1.651 m)   Wt 90.2 kg   SpO2 98%   BMI 33.08 kg/m  TM 98.1  NCAT HEENT: Nl Neck: supple with FROM Lungs: CTA CV: RRR ABD: gravid , NT No CVAT Pelvic: deferred EXT: neg c/c/e, SCDs intact Neuro: nonfocal Skin: intact  CBC    Component Value Date/Time   WBC 12.1 (H) 12/28/2018 0154   RBC 4.07 12/28/2018 0154   HGB 11.6 (L) 12/28/2018 0154   HCT 35.5 (L) 12/28/2018 0154   PLT 169 12/28/2018 0154   MCV 87.2 12/28/2018 0154   MCH 28.5 12/28/2018 0154   MCHC 32.7 12/28/2018 0154   RDW 13.2 12/28/2018 0154    Sono 4/13 Cephalic presentation, AFI 3, EFW 1700 gms  BPP 8/8, AFI 3.3 4/27  FHR 130-140s, 15x15accels, no  Recurrent decels, rare isolated variable,  rare contractions Category 1 tracing x 3. GBS neg  A: PPROM [redacted]w[redacted]d w IUP No s/s chorio  P: BPP today BMZ series complete Latency abx completed Completed MgSO4 neuroprophylaxis NICU consult done NST TID Monitor s/s chorio Inpatient until delivery Delivery timing- pt considering 34-35 wks. Will have NICU disuss risks /benefits of delaying from 34 to 35 wks. New ACOG guidelines discussed.

## 2019-01-13 NOTE — Progress Notes (Signed)
Patient ID: Courtney Palmer, female   DOB: 1985-10-31, 33 y.o.   MRN: [id number] HD#[id number] 34w 0d PPROM  SP Good FM. Continued LOF. No bleeding. Rare contractions. NO chills, No CP or SOB. Increased fluid leakage continues  O: BP (!) 105/57 (BP Location: Right Arm)   Pulse 78   Temp 97.8 F (36.6 C) (Oral)   Resp 20   Ht 5\' 5"  (1.651 m)   Wt 90.2 kg   SpO2 98%   BMI 33.08 kg/m  TM 98.5  NCAT HEENT: Nl Neck: supple with FROM Lungs: CTA CV: RRR ABD: gravid , NT No CVAT Pelvic: deferred EXT: neg c/c/e, SCDs intact Neuro: nonfocal Skin: intact  CBC    Component Value Date/Time   WBC 12.1 (H) 12/28/2018 0154   RBC 4.07 12/28/2018 0154   HGB 11.6 (L) 12/28/2018 0154   HCT 35.5 (L) 12/28/2018 0154   PLT 169 12/28/2018 0154   MCV 87.2 12/28/2018 0154   MCH 28.5 12/28/2018 0154   MCHC 32.7 12/28/2018 0154   RDW 13.2 12/28/2018 0154    Sono 4/13 Cephalic presentation, AFI 3, EFW 1700 gms  BPP 8/8, AFI 3.3 4/27  FHR 130-140s, 15x15accels, no  Recurrent decels, rare isolated variable,  rare contractions Category 1 tracing x 3. GBS neg  A: PPROM [redacted]w[redacted]d w IUP No s/s chorio  P: BPP today BMZ series complete Latency abx completed Completed MgSO4 neuroprophylaxis NICU consult done NST TID Monitor s/s chorio Inpatient until delivery Delivery timing- pt considering 34-35 wks. Will have NICU disuss risks /benefits of delaying from 34 to 35 wks. New ACOG guidelines discussed. NICU high census noted. Will discuss with NICU and plan IOL based on decision in conjunction with patient desires.

## 2019-01-14 LAB — CBC
HCT: 35.8 % — ABNORMAL LOW (ref 36.0–46.0)
Hemoglobin: 12 g/dL (ref 12.0–15.0)
MCH: 29.1 pg (ref 26.0–34.0)
MCHC: 33.5 g/dL (ref 30.0–36.0)
MCV: 86.7 fL (ref 80.0–100.0)
Platelets: 161 10*3/uL (ref 150–400)
RBC: 4.13 MIL/uL (ref 3.87–5.11)
RDW: 13.9 % (ref 11.5–15.5)
WBC: 9.8 10*3/uL (ref 4.0–10.5)
nRBC: 0 % (ref 0.0–0.2)

## 2019-01-14 LAB — TYPE AND SCREEN
ABO/RH(D): O POS
Antibody Screen: NEGATIVE

## 2019-01-14 NOTE — Progress Notes (Signed)
Patient ID: Courtney Palmer, female   DOB: 1986-08-06, 33 y.o.   MRN: [id number] HD#[id number] 34w 1d PPROM  SP Good FM. Continued LOF. No bleeding. Rare contractions. NO chills, No CP or SOB. Increased fluid leakage continues  O: BP 91/60 (BP Location: Right Arm)   Pulse 96   Temp 98.2 F (36.8 C) (Oral)   Resp 18   Ht 5\' 5"  (1.651 m)   Wt 90.2 kg   SpO2 99%   BMI 33.08 kg/m  TM 98.5  NCAT HEENT: Nl Neck: supple with FROM Lungs: CTA CV: RRR ABD: gravid , NT No CVAT Pelvic: FT/50 /-2 EXT: neg c/c/e, SCDs intact Neuro: nonfocal Skin: intact  CBC    Component Value Date/Time   WBC 12.1 (H) 12/28/2018 0154   RBC 4.07 12/28/2018 0154   HGB 11.6 (L) 12/28/2018 0154   HCT 35.5 (L) 12/28/2018 0154   PLT 169 12/28/2018 0154   MCV 87.2 12/28/2018 0154   MCH 28.5 12/28/2018 0154   MCHC 32.7 12/28/2018 0154   RDW 13.2 12/28/2018 0154    Sono 4/13 Cephalic presentation, AFI 3, EFW 1700 gms  BPP 8/8, AFI 3.3 4/27  FHR 130-140s, 15x15accels, no  Recurrent decels, rare isolated variable,  rare contractions Category 1 tracing x 3. GBS neg  A: PPROM [redacted]w[redacted]d w IUP No s/s chorio  P: Add CBC and type and screen BMZ series complete Latency abx completed Completed MgSO4 neuroprophylaxis NICU consult done NST TID Monitor s/s chorio Inpatient until delivery IOL tomorrow. NICU aware

## 2019-01-15 ENCOUNTER — Inpatient Hospital Stay (HOSPITAL_COMMUNITY): Payer: 59 | Admitting: Anesthesiology

## 2019-01-15 ENCOUNTER — Encounter (HOSPITAL_COMMUNITY): Payer: Self-pay | Admitting: Pediatrics

## 2019-01-15 MED ORDER — LACTATED RINGERS IV SOLN: 500.0000 mL | INTRAVENOUS | Status: DC | PRN

## 2019-01-15 MED ORDER — OXYTOCIN 40 UNITS IN NORMAL SALINE INFUSION - SIMPLE MED
INTRAVENOUS | Status: AC
  Filled 2019-01-15: qty 1000

## 2019-01-15 MED ORDER — OXYCODONE-ACETAMINOPHEN 5-325 MG PO TABS: 2.0000 | ORAL_TABLET | ORAL | Status: DC | PRN

## 2019-01-15 MED ORDER — ONDANSETRON HCL 4 MG/2ML IJ SOLN: 4.0000 mg | INTRAMUSCULAR | Status: DC | PRN

## 2019-01-15 MED ORDER — OXYTOCIN 40 UNITS IN NORMAL SALINE INFUSION - SIMPLE MED
1.0000 m[IU]/min | INTRAVENOUS | Status: DC
  Administered 2019-01-15: 2 m[IU]/min via INTRAVENOUS

## 2019-01-15 MED ORDER — BENZOCAINE-MENTHOL 20-0.5 % EX AERO
1.0000 "application " | INHALATION_SPRAY | CUTANEOUS | Status: DC | PRN
  Administered 2019-01-15: 1 via TOPICAL
  Filled 2019-01-15: qty 56

## 2019-01-15 MED ORDER — ACETAMINOPHEN 325 MG PO TABS: 650.0000 mg | ORAL_TABLET | ORAL | Status: DC | PRN

## 2019-01-15 MED ORDER — WITCH HAZEL-GLYCERIN EX PADS
1.0000 "application " | MEDICATED_PAD | CUTANEOUS | Status: DC | PRN
  Administered 2019-01-17: 1 via TOPICAL

## 2019-01-15 MED ORDER — LACTATED RINGERS IV SOLN: 500.0000 mL | Freq: Once | INTRAVENOUS | Status: DC

## 2019-01-15 MED ORDER — SOD CITRATE-CITRIC ACID 500-334 MG/5ML PO SOLN
30.0000 mL | ORAL | Status: DC | PRN
  Administered 2019-01-15: 30 mL via ORAL
  Filled 2019-01-15: qty 30

## 2019-01-15 MED ORDER — PRENATAL MULTIVITAMIN CH
1.0000 | ORAL_TABLET | Freq: Every day | ORAL | Status: DC
  Administered 2019-01-16: 14:00:00 1 via ORAL
  Filled 2019-01-15 (×2): qty 1

## 2019-01-15 MED ORDER — FENTANYL-BUPIVACAINE-NACL 0.5-0.125-0.9 MG/250ML-% EP SOLN
12.0000 mL/h | EPIDURAL | Status: DC | PRN
  Filled 2019-01-15: qty 250

## 2019-01-15 MED ORDER — TETANUS-DIPHTH-ACELL PERTUSSIS 5-2.5-18.5 LF-MCG/0.5 IM SUSP: 0.5000 mL | Freq: Once | INTRAMUSCULAR | Status: DC

## 2019-01-15 MED ORDER — DIBUCAINE (PERIANAL) 1 % EX OINT: 1.0000 "application " | TOPICAL_OINTMENT | CUTANEOUS | Status: DC | PRN

## 2019-01-15 MED ORDER — TERBUTALINE SULFATE 1 MG/ML IJ SOLN: 0.2500 mg | Freq: Once | INTRAMUSCULAR | Status: DC | PRN

## 2019-01-15 MED ORDER — DIPHENHYDRAMINE HCL 50 MG/ML IJ SOLN: 12.5000 mg | INTRAMUSCULAR | Status: DC | PRN

## 2019-01-15 MED ORDER — IBUPROFEN 600 MG PO TABS
600.0000 mg | ORAL_TABLET | Freq: Four times a day (QID) | ORAL | Status: DC
  Administered 2019-01-15 – 2019-01-17 (×6): 600 mg via ORAL
  Filled 2019-01-15 (×7): qty 1

## 2019-01-15 MED ORDER — METHYLERGONOVINE MALEATE 0.2 MG/ML IJ SOLN: 0.2000 mg | INTRAMUSCULAR | Status: DC | PRN

## 2019-01-15 MED ORDER — PHENYLEPHRINE 40 MCG/ML (10ML) SYRINGE FOR IV PUSH (FOR BLOOD PRESSURE SUPPORT)
80.0000 ug | PREFILLED_SYRINGE | INTRAVENOUS | Status: DC | PRN
  Filled 2019-01-15: qty 10

## 2019-01-15 MED ORDER — ONDANSETRON HCL 4 MG/2ML IJ SOLN: 4.0000 mg | Freq: Four times a day (QID) | INTRAMUSCULAR | Status: DC | PRN

## 2019-01-15 MED ORDER — LACTATED RINGERS IV SOLN
INTRAVENOUS | Status: DC
  Administered 2019-01-15: 09:00:00 via INTRAVENOUS

## 2019-01-15 MED ORDER — LIDOCAINE HCL (PF) 1 % IJ SOLN: 30.0000 mL | INTRAMUSCULAR | Status: DC | PRN

## 2019-01-15 MED ORDER — OXYTOCIN 40 UNITS IN NORMAL SALINE INFUSION - SIMPLE MED: 2.5000 [IU]/h | INTRAVENOUS | Status: DC

## 2019-01-15 MED ORDER — OXYCODONE-ACETAMINOPHEN 5-325 MG PO TABS
1.0000 | ORAL_TABLET | ORAL | Status: DC | PRN
  Administered 2019-01-16: 1 via ORAL
  Filled 2019-01-15: qty 1

## 2019-01-15 MED ORDER — SIMETHICONE 80 MG PO CHEW: 80.0000 mg | CHEWABLE_TABLET | ORAL | Status: DC | PRN

## 2019-01-15 MED ORDER — SENNOSIDES-DOCUSATE SODIUM 8.6-50 MG PO TABS
2.0000 | ORAL_TABLET | ORAL | Status: DC
  Administered 2019-01-15 – 2019-01-16 (×2): 2 via ORAL
  Filled 2019-01-15 (×2): qty 2

## 2019-01-15 MED ORDER — COCONUT OIL OIL
1.0000 "application " | TOPICAL_OIL | Status: DC | PRN
  Administered 2019-01-15: 1 via TOPICAL

## 2019-01-15 MED ORDER — SODIUM CHLORIDE (PF) 0.9 % IJ SOLN
INTRAMUSCULAR | Status: DC | PRN
  Administered 2019-01-15: 14 mL/h via EPIDURAL

## 2019-01-15 MED ORDER — METHYLERGONOVINE MALEATE 0.2 MG PO TABS: 0.2000 mg | ORAL_TABLET | ORAL | Status: DC | PRN

## 2019-01-15 MED ORDER — LIDOCAINE HCL (PF) 1 % IJ SOLN
INTRAMUSCULAR | Status: DC | PRN
  Administered 2019-01-15: 11 mL via EPIDURAL

## 2019-01-15 MED ORDER — DIPHENHYDRAMINE HCL 25 MG PO CAPS: 25.0000 mg | ORAL_CAPSULE | Freq: Four times a day (QID) | ORAL | Status: DC | PRN

## 2019-01-15 MED ORDER — OXYCODONE-ACETAMINOPHEN 5-325 MG PO TABS: 1.0000 | ORAL_TABLET | ORAL | Status: DC | PRN

## 2019-01-15 MED ORDER — ONDANSETRON HCL 4 MG PO TABS: 4.0000 mg | ORAL_TABLET | ORAL | Status: DC | PRN

## 2019-01-15 MED ORDER — ACETAMINOPHEN 325 MG PO TABS
650.0000 mg | ORAL_TABLET | ORAL | Status: DC | PRN
  Administered 2019-01-15: 650 mg via ORAL
  Filled 2019-01-15: qty 2

## 2019-01-15 MED ORDER — ZOLPIDEM TARTRATE 5 MG PO TABS
5.0000 mg | ORAL_TABLET | Freq: Every evening | ORAL | Status: DC | PRN
  Administered 2019-01-15 – 2019-01-16 (×2): 5 mg via ORAL
  Filled 2019-01-15 (×2): qty 1

## 2019-01-15 MED ORDER — EPHEDRINE 5 MG/ML INJ
10.0000 mg | INTRAVENOUS | Status: DC | PRN
  Filled 2019-01-15: qty 2

## 2019-01-15 MED ORDER — OXYTOCIN BOLUS FROM INFUSION
500.0000 mL | Freq: Once | INTRAVENOUS | Status: AC
  Administered 2019-01-15: 500 mL via INTRAVENOUS

## 2019-01-15 NOTE — Progress Notes (Signed)
Courtney Palmer is a 33 y.o. G1P0 at [redacted]w[redacted]d by LMP admitted for PPROM  Subjective: Getting uncomfortable  Objective: BP 105/64   Pulse 88   Temp 98.5 F (36.9 C)   Resp 14   Ht 5\' 5"  (1.651 m)   Wt 90.2 kg   SpO2 100%   BMI 33.08 kg/m  I/O last 3 completed shifts: In: 360 [P.O.:360] Out: -  No intake/output data recorded.  FHT:  FHR: 145 bpm, variability: moderate,  accelerations:  Present,  decelerations:  Absent UC:   irregular, every 3 minutes SVE:   Dilation: 2.5 Effacement (%): 60 Station: -2 Exam by:: Courtney Palmer  Labs: Lab Results  Component Value Date   WBC 9.8 01/14/2019   HGB 12.0 01/14/2019   HCT 35.8 (L) 01/14/2019   MCV 86.7 01/14/2019   PLT 161 01/14/2019    Assessment / Plan: Induction of labor due to PROM,  progressing well on pitocin  Labor: Progressing normally Preeclampsia:  no signs or symptoms of toxicity Fetal Wellbeing:  Category I Pain Control:  Labor support without medications I/D:  n/a Anticipated MOD:  NSVD  Courtney Palmer 01/15/2019, 8:31 AM

## 2019-01-15 NOTE — Anesthesia Postprocedure Evaluation (Signed)
Anesthesia Post Note  Patient: Courtney Palmer  Procedure(s) Performed: AN AD HOC LABOR EPIDURAL     Patient location during evaluation: OB High Risk Anesthesia Type: Epidural Level of consciousness: awake, awake and alert, oriented and patient cooperative Pain management: pain level controlled Vital Signs Assessment: post-procedure vital signs reviewed and stable Respiratory status: spontaneous breathing, nonlabored ventilation and respiratory function stable Cardiovascular status: stable Postop Assessment: no headache, no backache, epidural receding, patient able to bend at knees and no apparent nausea or vomiting Anesthetic complications: no Comments: Interview over the phone.    Last Vitals:  Vitals:   01/15/19 1401 01/15/19 1521  BP: 116/73 (!) 111/54  Pulse: 87 93  Resp:    Temp:  37.7 C  SpO2:  100%    Last Pain:  Vitals:   01/15/19 1521  TempSrc: Oral  PainSc:    Pain Goal: Patients Stated Pain Goal: 2 (01/14/19 0800)                 Kiarah Eckstein L

## 2019-01-15 NOTE — Lactation Note (Signed)
This note was copied from a baby's chart. Lactation Consultation Note  Patient Name: Courtney Palmer PVXYI'A Date: 01/15/2019 Reason for consult: Initial assessment;Primapara;1st time breastfeeding;Infant < 6lbs;NICU baby;Late-preterm 34-36.6wks  Visited with mom of an 8 hours old LPI < 5 lbs NICU female. Mom is a P1 and she's already pumping, her RN set her up with a DEBP, LC reviewed instructions and milk storage guidelines for NICU babies with mom. Per mom pumping is becoming very uncomfortable, she has only done it once and "nothing came out" and it was also painful. Explained to mom that the purpose of pumping early on is mainly for breast stimulation and not to get volume; she voiced understanding. She has a DEBP at home.  Mom is already familiar with hand expression and able to get some drops with the help of her RN when doing so. LC reviewed the different setting of her pump and advised mom to turn down the suction if it's too painful for her to bear. She also got coconut oil in her room but she's no using it for pumping.  Feeding plan:  1. Encouraged mom to pump every 2-3 hours and at least once at night 2. Mom will start using coconut oil prior pumping 3. Once she starts getting volume, she'll start turning her EBM to the NICU  BF brochure, BF resources and pumping log were reviewed. Parents reported all questions and concerns were answered, they're both aware of LC services and will call PRN.  Maternal Data Formula Feeding for Exclusion: No Has patient been taught Hand Expression?: Yes Does the patient have breastfeeding experience prior to this delivery?: No  Feeding    Interventions Interventions: Breast feeding basics reviewed;DEBP;Coconut oil  Lactation Tools Discussed/Used Tools: Pump;Coconut oil Breast pump type: Double-Electric Breast Pump WIC Program: No Pump Review: Setup, frequency, and cleaning;Milk Storage Initiated by:: RN and IBCLC Date initiated::  01/15/19   Consult Status Consult Status: Follow-up Date: 01/16/19 Follow-up type: In-patient    Courtney Palmer 01/15/2019, 9:12 PM

## 2019-01-15 NOTE — Anesthesia Procedure Notes (Signed)
Epidural Patient location during procedure: OB Start time: 01/15/2019 9:07 AM End time: 01/15/2019 9:18 AM  Staffing Anesthesiologist: Lowella Curb, MD Performed: anesthesiologist   Preanesthetic Checklist Completed: patient identified, site marked, surgical consent, pre-op evaluation, timeout performed, IV checked, risks and benefits discussed and monitors and equipment checked  Epidural Patient position: sitting Prep: ChloraPrep Patient monitoring: heart rate, cardiac monitor, continuous pulse ox and blood pressure Approach: midline Location: L2-L3 Injection technique: LOR saline  Needle:  Needle type: Tuohy  Needle gauge: 17 G Needle length: 9 cm Needle insertion depth: 5 cm Catheter type: closed end flexible Catheter size: 20 Guage Catheter at skin depth: 9 cm Test dose: negative  Assessment Events: blood not aspirated, injection not painful, no injection resistance, negative IV test and no paresthesia  Additional Notes Reason for block:procedure for pain

## 2019-01-15 NOTE — Anesthesia Preprocedure Evaluation (Signed)

## 2019-01-16 LAB — CBC
HCT: 33.3 % — ABNORMAL LOW (ref 36.0–46.0)
Hemoglobin: 11.1 g/dL — ABNORMAL LOW (ref 12.0–15.0)
MCH: 29.4 pg (ref 26.0–34.0)
MCHC: 33.3 g/dL (ref 30.0–36.0)
MCV: 88.1 fL (ref 80.0–100.0)
Platelets: 146 10*3/uL — ABNORMAL LOW (ref 150–400)
RBC: 3.78 MIL/uL — ABNORMAL LOW (ref 3.87–5.11)
RDW: 14 % (ref 11.5–15.5)
WBC: 11.8 10*3/uL — ABNORMAL HIGH (ref 4.0–10.5)
nRBC: 0 % (ref 0.0–0.2)

## 2019-01-16 NOTE — Lactation Note (Signed)
This note was copied from a baby's chart. Lactation Consultation Note  Patient Name: Courtney Palmer GNOIB'B Date: 01/16/2019 Reason for consult: Follow-up assessment;Primapara;1st time breastfeeding;Infant < 6lbs;NICU baby;Late-preterm 34-36.6wks  Baby is 83 hours old in NICU ( 34 3/7 days)  As LC entered the room mom sitting on the side pumping with #24 F both breast. Per mom comfortable.  Mom has started the pump and forgot their was two different modes.  LC reviewed the pump set up ( initiation and maintenance ) LC assessed the flange the #24 F appeared  Slightly snug and per mom comfortable when pumping. After pumping mentioned they could be more  Comfortable. LC reminded mom she has #27 flanges she cam increase to and also use a dab of coconut oil  To decrease friction for comfort.  LC reviewed supply and demand / importance of consistent pumping around the clock.  Discussed power pumping once a day ( 20 mins on 10 min off over 60 mins ) for extra stimulation/ save milk  For baby in NICU. Consider hand expressing prior to pumping and afterwards.  Reviewed storage of breast milk and encouraged mom when she is visiting baby in NICU to take pump pieces  To pump in front of the baby.     Maternal Data Has patient been taught Hand Expression?: Yes(drops with pumping )  Feeding Feeding Type: Donor Breast Milk Nipple Type: Slow - flow  LATCH Score                   Interventions Interventions: Breast feeding basics reviewed;DEBP  Lactation Tools Discussed/Used Tools: Pump(per mom has pumped 2-3  times / enc to increase ) Breast pump type: Double-Electric Breast Pump Pump Review: Setup, frequency, and cleaning;Milk Storage(reviewed / MAI )   Consult Status Consult Status: Follow-up Date: 01/17/19 Follow-up type: In-patient    Matilde Sprang Remo Kirschenmann 01/16/2019, 10:37 AM

## 2019-01-16 NOTE — Progress Notes (Signed)
PPD 1 SVD at with 2nd degree repair after PROM since 12/27/2018  Seen by Dr Billy Coast on rounds for discharge  Marlinda Mike CNM [facility]

## 2019-01-16 NOTE — Progress Notes (Signed)
Post Partum Day 1 Subjective: no complaints, up ad lib, voiding, tolerating PO and + flatus  Objective: Blood pressure 114/72, pulse 88, temperature 98.1 F (36.7 C), temperature source Oral, resp. rate 18, height 5\' 5"  (1.651 m), weight 90.2 kg, SpO2 99 %, unknown if currently breastfeeding.  Physical Exam:  General: alert, cooperative and appears stated age Lochia: appropriate Uterine Fundus: firm Incision: na. Perineum intact DVT Evaluation: Negative Homan's sign. No cords or calf tenderness.  Recent Labs    01/14/19 0914 01/16/19 0604  HGB 12.0 11.1*  HCT 35.8* 33.3*    Assessment/Plan: Stable PPD 1 Baby doing well in NICU Plan for discharge tomorrow, Breastfeeding and Circumcision prior to discharge   LOS: 19 days   Anelisse Jacobson J 01/16/2019, 11:05 AM

## 2019-01-17 NOTE — Discharge Instructions (Signed)
Vaginal Delivery, Care After °Refer to this sheet in the next few weeks. These instructions provide you with information about caring for yourself after vaginal delivery. Your health care provider may also give you more specific instructions. Your treatment has been planned according to current medical practices, but problems sometimes occur. Call your health care provider if you have any problems or questions. °What can I expect after the procedure? °After vaginal delivery, it is common to have: °· Some bleeding from your vagina. °· Soreness in your abdomen, your vagina, and the area of skin between your vaginal opening and your anus (perineum). °· Pelvic cramps. °· Fatigue. °Follow these instructions at home: °Medicines °· Take over-the-counter and prescription medicines only as told by your health care provider. °· If you were prescribed an antibiotic medicine, take it as told by your health care provider. Do not stop taking the antibiotic until it is finished. °Driving ° °· Do not drive or operate heavy machinery while taking prescription pain medicine. °· Do not drive for 24 hours if you received a sedative. °Lifestyle °· Do not drink alcohol. This is especially important if you are breastfeeding or taking medicine to relieve pain. °· Do not use tobacco products, including cigarettes, chewing tobacco, or e-cigarettes. If you need help quitting, ask your health care provider. °Eating and drinking °· Drink at least 8 eight-ounce glasses of water every day unless you are told not to by your health care provider. If you choose to breastfeed your baby, you may need to drink more water than this. °· Eat high-fiber foods every day. These foods may help prevent or relieve constipation. High-fiber foods include: °? Whole grain cereals and breads. °? Brown rice. °? Beans. °? Fresh fruits and vegetables. °Activity °· Return to your normal activities as told by your health care provider. Ask your health care provider what  activities are safe for you. °· Rest as much as possible. Try to rest or take a nap when your baby is sleeping. °· Do not lift anything that is heavier than your baby or 10 lb (4.5 kg) until your health care provider says that it is safe. °· Talk with your health care provider about when you can engage in sexual activity. This may depend on your: °? Risk of infection. °? Rate of healing. °? Comfort and desire to engage in sexual activity. °Vaginal Care °· If you have an episiotomy or a vaginal tear, check the area every day for signs of infection. Check for: °? More redness, swelling, or pain. °? More fluid or blood. °? Warmth. °? Pus or a bad smell. °· Do not use tampons or douches until your health care provider says this is safe. °· Watch for any blood clots that may pass from your vagina. These may look like clumps of dark red, brown, or black discharge. °General instructions °· Keep your perineum clean and dry as told by your health care provider. °· Wear loose, comfortable clothing. °· Wipe from front to back when you use the toilet. °· Ask your health care provider if you can shower or take a bath. If you had an episiotomy or a perineal tear during labor and delivery, your health care provider may tell you not to take baths for a certain length of time. °· Wear a bra that supports your breasts and fits you well. °· If possible, have someone help you with household activities and help care for your baby for at least a few days after you   leave the hospital. °· Keep all follow-up visits for you and your baby as told by your health care provider. This is important. °Contact a health care provider if: °· You have: °? Vaginal discharge that has a bad smell. °? Difficulty urinating. °? Pain when urinating. °? A sudden increase or decrease in the frequency of your bowel movements. °? More redness, swelling, or pain around your episiotomy or vaginal tear. °? More fluid or blood coming from your episiotomy or vaginal  tear. °? Pus or a bad smell coming from your episiotomy or vaginal tear. °? A fever. °? A rash. °? Little or no interest in activities you used to enjoy. °? Questions about caring for yourself or your baby. °· Your episiotomy or vaginal tear feels warm to the touch. °· Your episiotomy or vaginal tear is separating or does not appear to be healing. °· Your breasts are painful, hard, or turn red. °· You feel unusually sad or worried. °· You feel nauseous or you vomit. °· You pass large blood clots from your vagina. If you pass a blood clot from your vagina, save it to show to your health care provider. Do not flush blood clots down the toilet without having your health care provider look at them. °· You urinate more than usual. °· You are dizzy or light-headed. °· You have not breastfed at all and you have not had a menstrual period for 12 weeks after delivery. °· You have stopped breastfeeding and you have not had a menstrual period for 12 weeks after you stopped breastfeeding. °Get help right away if: °· You have: °? Pain that does not go away or does not get better with medicine. °? Chest pain. °? Difficulty breathing. °? Blurred vision or spots in your vision. °? Thoughts about hurting yourself or your baby. °· You develop pain in your abdomen or in one of your legs. °· You develop a severe headache. °· You faint. °· You bleed from your vagina so much that you fill two sanitary pads in one hour. °This information is not intended to replace advice given to you by your health care provider. Make sure you discuss any questions you have with your health care provider. °Document Released: 08/30/2000 Document Revised: 02/14/2016 Document Reviewed: 09/17/2015 °Elsevier Interactive Patient Education © 2019 [employer] ° °

## 2019-01-17 NOTE — Progress Notes (Signed)
Post Partum Day 2 Subjective: no complaints, up ad lib, voiding, tolerating PO and + flatus  Objective: Blood pressure (!) 94/48, pulse 83, temperature 98.3 F (36.8 C), temperature source Oral, resp. rate 18, height 5\' 5"  (1.651 m), weight 90.2 kg, SpO2 99 %, unknown if currently breastfeeding.  Physical Exam:  General: alert, cooperative and appears stated age Lochia: appropriate Uterine Fundus: firm Incision: na. Perineum intact DVT Evaluation: Negative Homan's sign. No cords or calf tenderness.  Recent Labs    01/16/19 0604  HGB 11.1*  HCT 33.3*    Assessment/Plan: Stable PPD 2 Baby doing well in NICU DC home PNV, Motrin FU office 6w   LOS: 20 days   Lahari Suttles J 01/17/2019, 12:03 PM

## 2019-01-17 NOTE — Discharge Summary (Signed)
Obstetric Discharge Summary Reason for Admission: rupture of membranes Prenatal Procedures: NST and ultrasound Intrapartum Procedures: spontaneous vaginal delivery Postpartum Procedures: none Complications-Operative and Postpartum: 2nd degree perineal laceration Hemoglobin  Date Value Ref Range Status  01/16/2019 11.1 (L) 12.0 - 15.0 g/dL Final   HCT  Date Value Ref Range Status  01/16/2019 33.3 (L) 36.0 - 46.0 % Final    Physical Exam:  General: alert, cooperative and appears stated age 33: appropriate Uterine Fundus: firm Incision: healing well, no significant drainage DVT Evaluation: Negative Homan's sign. No cords or calf tenderness.  Discharge Diagnoses: PPROM delivered at 34 weeks  Discharge Information: Date: 01/17/2019 Activity: pelvic rest Diet: routine Medications: PNV and Ibuprofen Condition: stable Instructions: refer to practice specific booklet Discharge to: home   Newborn Data: Live born female  Birth Weight: 4 lb 9.7 oz (2090 g) APGAR: 9, 9  Newborn Delivery   Birth date/time:  01[birth year]15/2019 12:49:00 Delivery type:  Vaginal, Spontaneous     Home with NICU.  Courtney Palmer 01/17/2019, 12:04 PM

## 2019-01-17 NOTE — Lactation Note (Signed)
This note was copied from a baby's chart. Lactation Consultation Note  Patient Name: Courtney Palmer [id number] Date: 01/17/2019 Reason for consult: Follow-up assessment;Primapara;1st time breastfeeding;Late-preterm 34-36.6wks;NICU baby;Other (Comment)(mom for D/C today )  Baby is 51 hours old  [place] visited mom on her room on 1s #115  Mom expressed excitement she finally filled  1 colostrum bullet of milk and has pumped with the DEBP x 6 .  LC recommended and encouraged mom to increase today up to 8 x's around the clock.  Days every 2 -3 hours / one power pumping / and at least by 4 hours 11-7 am.  LC explained the power pumping - 20 mins on 10 mins off for 60 mins.  Mom denies sore nipples. Sore nipples and engorgement prevention and tx reviewed.  Mom aware she has #24 F and #27 flanges with her kit and when her breast are really full or  Engorgement increasing the flange will help to soften the breast for relief.  LC encouraged and recommended to mom to plan on pumping in NICU when visiting baby. And use her Spectra 2  at home .  Mom aware of the breast feeding phone line and when visiting the baby in NICU she can request  For her RN to call [facility] with questions or when she is able to latch the baby.  LC encouraged plenty of rest / naps/ and fluids and nutritious snacks and meals.   Mom receptive to breast feeding teaching , motivated to provide milk for her baby and expressed  Appreciation for [facility] consult.     Maternal Data Has patient been taught Hand Expression?: Yes(LC encouraged mom to hand express prior to pumping and after pumping to enhance let down and increase volume )  Feeding Feeding Type: Donor Breast Milk Nipple Type: Nfant Slow Flow (purple)  LATCH Score                   Interventions Interventions: Breast feeding basics reviewed;Hand express;DEBP  Lactation Tools Discussed/Used Tools: Pump(per mom has pumped x 6 times in the last 24 hours ) Breast pump  type: Double-Electric Breast Pump   Consult Status Consult Status: PRN Date: (baby is in NICU ) Follow-up type: Other (comment)(baby in NICU )    Myer Haff 01/17/2019, 10:53 AM

## 2019-01-17 NOTE — Progress Notes (Signed)
Discharge instructions and prescriptions given to pt. Discussed post vaginal delivery care, signs and symptoms to report to the MD, upcoming appointments, and meds. Pt verbalizes understanding and has no questions at this time. Pt discharged from hospital in stable condition. 

## 2019-01-18 ENCOUNTER — Encounter (HOSPITAL_COMMUNITY): Payer: Self-pay

## 2019-01-22 ENCOUNTER — Ambulatory Visit: Payer: Self-pay

## 2019-01-22 NOTE — Lactation Note (Signed)
This note was copied from a baby's chart. Lactation Consultation Note  Patient Name: Courtney Palmer [id number] Date: 01/22/2019 Reason for consult: Follow-up assessment;NICU baby;Late-preterm 34-36.6wks Called to NICU for latch assist.  Baby is 50 days old and 35.2 PMA. This is mom's first baby.  She is pumping every 2-2.5 hours and now obtaining 60 mls.  Discussed expectations and norms for late preterm breastfeeding.  Reassured mom that feedings should improve as baby reaches term. Baby is sleepy.  Positioned baby in football hold.  Mom's nipples are flat.  Baby opened and suckled a few times before slipping off breast.  20 mm nipple shield applied.  Baby latched and sucked off and on for 5 minutes and then fell asleep.  Encouraged mom to keep baby nuzzled at breast during NG feeding. Encouraged to call for assist prn. Maternal Data    Feeding Feeding Type: Breast Fed  LATCH Score Latch: Repeated attempts needed to sustain latch, nipple held in mouth throughout feeding, stimulation needed to elicit sucking reflex.  Audible Swallowing: None  Type of Nipple: Flat  Comfort (Breast/Nipple): Soft / non-tender  Hold (Positioning): Assistance needed to correctly position infant at breast and maintain latch.  LATCH Score: 5  Interventions    Lactation Tools Discussed/Used Tools: Nipple Shields Nipple shield size: 20   Consult Status Consult Status: PRN Follow-up type: In-patient    Huston Foley 01/22/2019, 12:50 PM

## 2019-04-26 IMAGING — US US MFM OB COMP + 14 WK
1 series · 14 of 28 positions shown · non-contrast
Comparison: none

[Series 1: us mfm ob comp + 14 wk · 35 acquisitions, 14 frames shown]
[im 2/35]
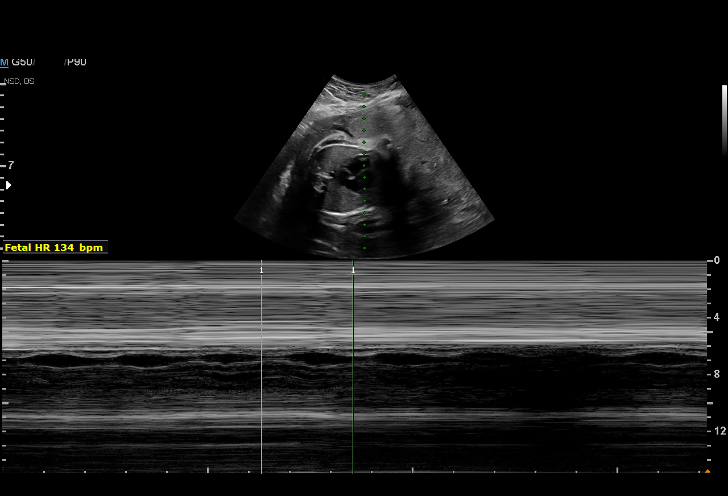
[im 4/35]
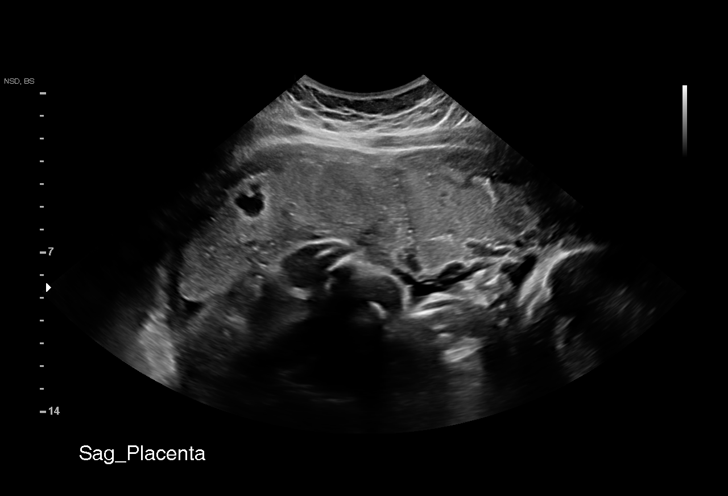
[im 7/35]
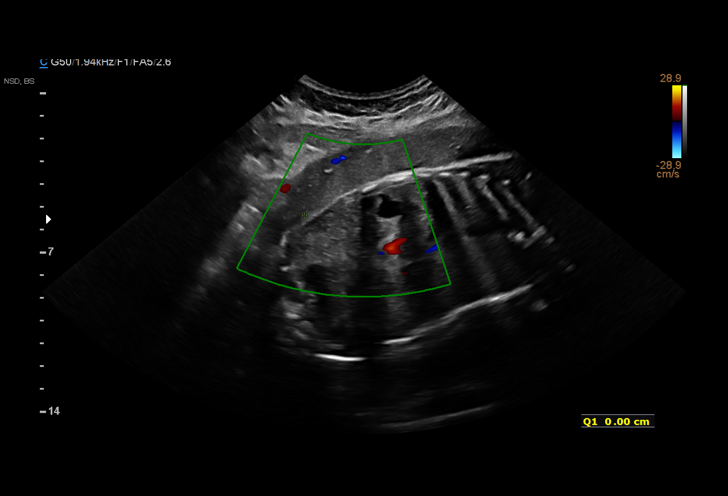
[im 9/35]
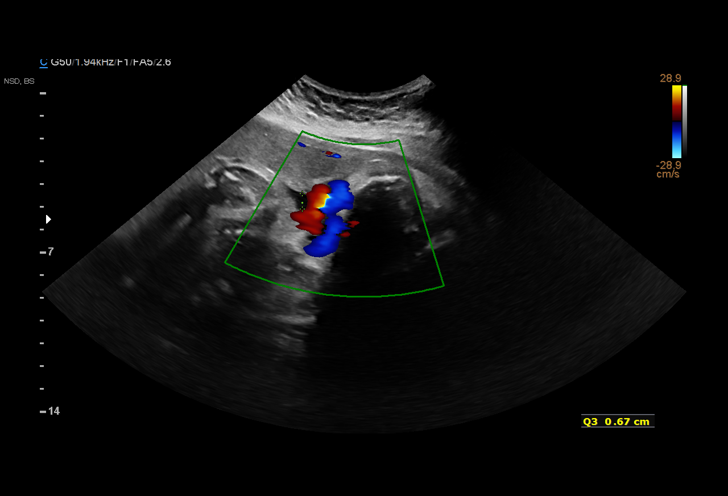
[im 12/35]
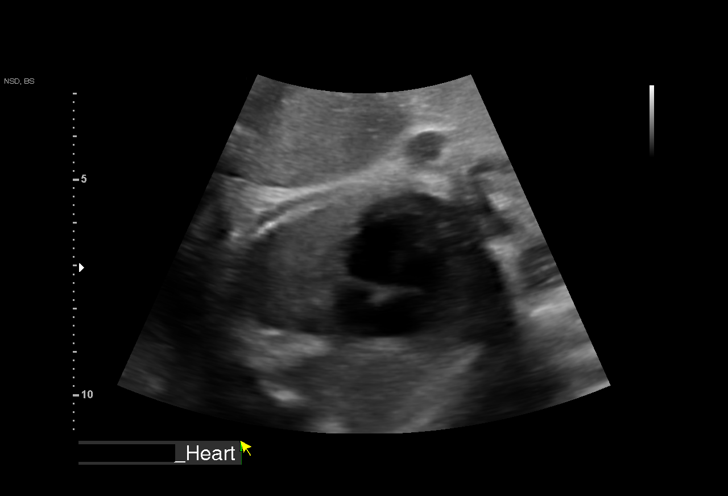
[im 14/35]
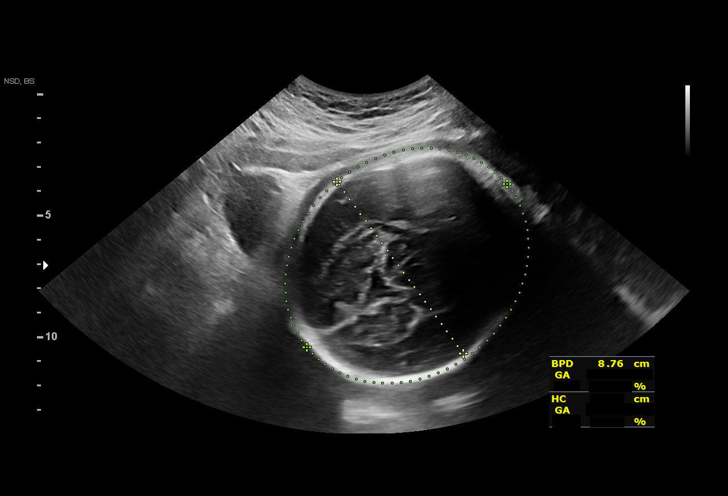
[im 17/35]
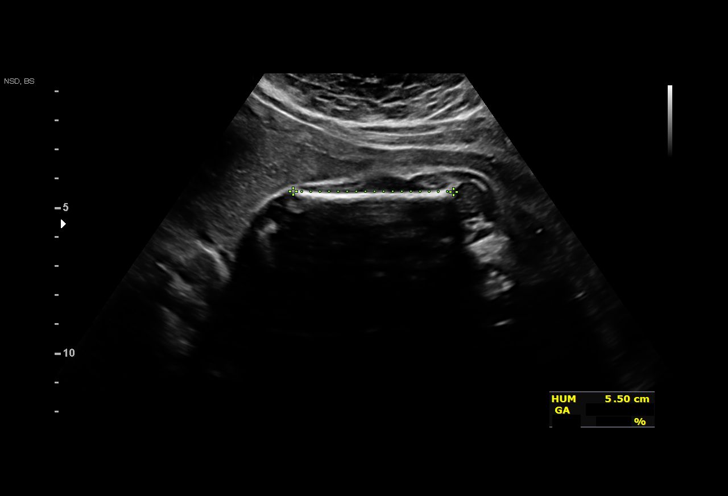
[im 19/35]
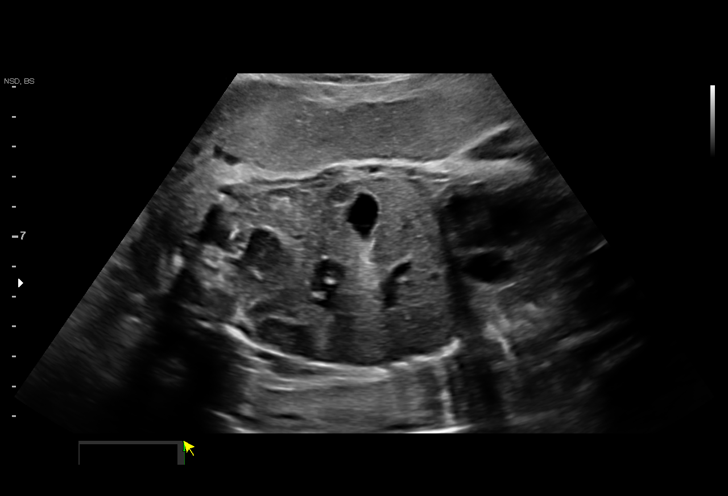
[im 22/35]
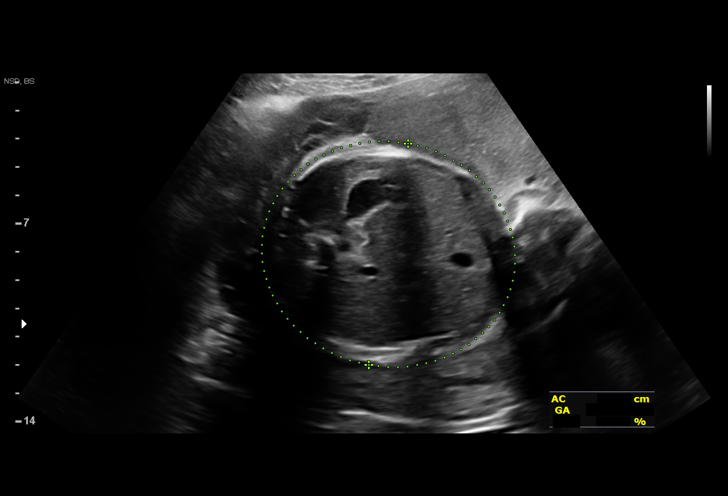
[im 24/35]
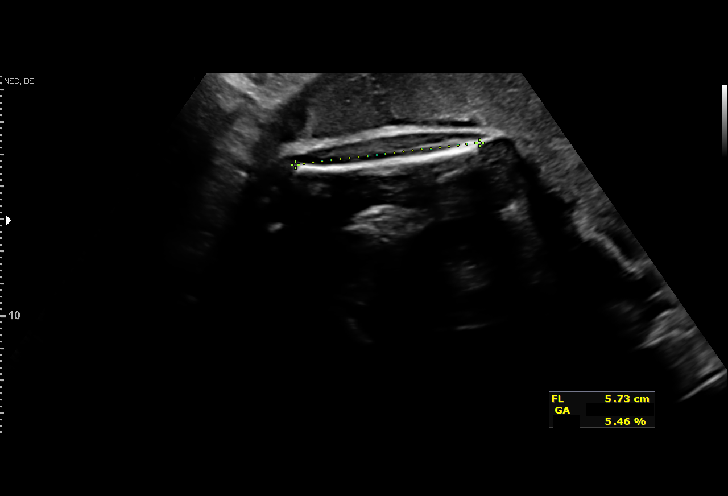
[im 27/35]
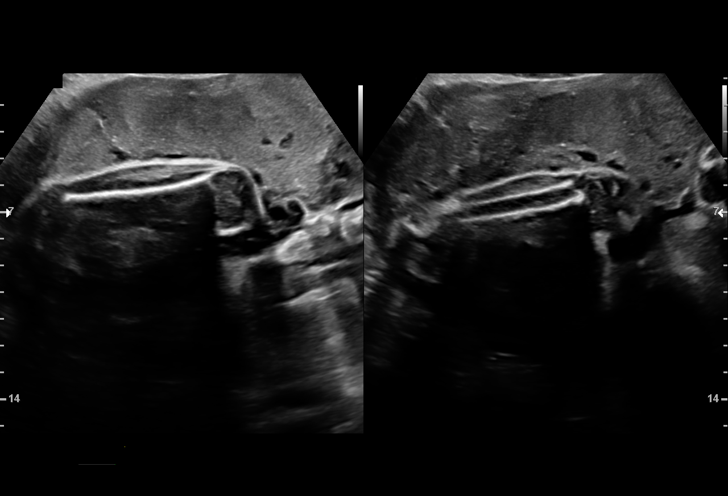
[im 29/35]
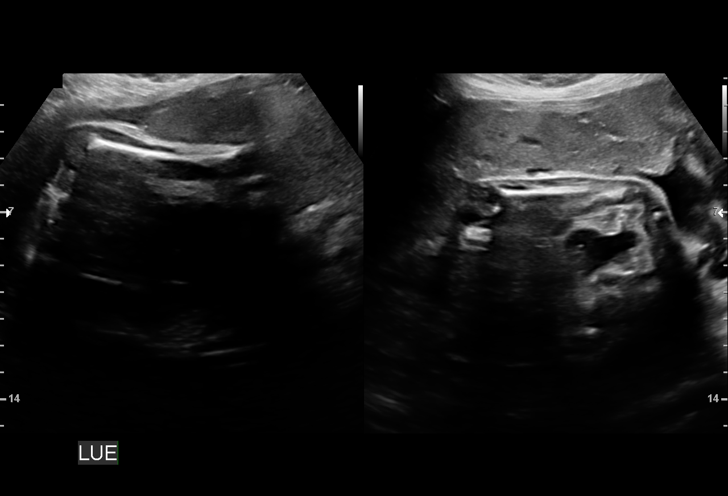
[im 32/35]
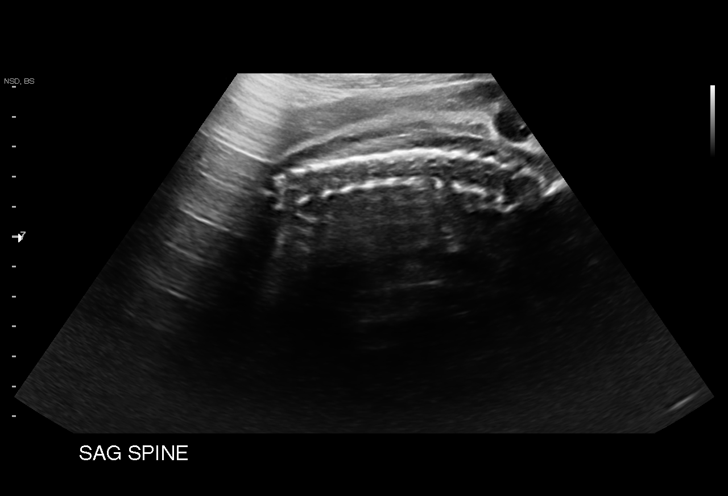
[im 35/35]
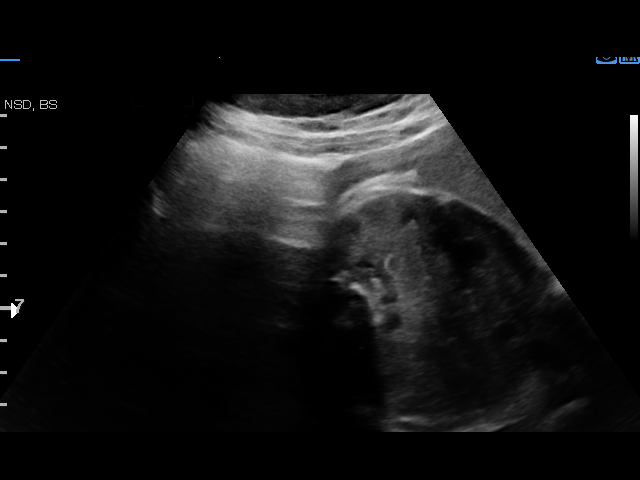

[14 of 28 positions shown; findings below may reference images not displayed]

Care
 Referred By:      AKU SIKA TANIHU

  1  US MFM OB COMP + 14 WK               76805.01     AKU SIKA TANIHU
 ----------------------------------------------------------------------

 ----------------------------------------------------------------------
Indications

  Encounter for antenatal screening,
  unspecified (EFW)
  31 weeks gestation of pregnancy
  Premature rupture of membranes - leaking
  fluid
 ----------------------------------------------------------------------
Fetal Evaluation

 Num Of Fetuses:         1
 Fetal Heart Rate(bpm):  134
 Cardiac Activity:       Observed
 Presentation:           Cephalic
 Placenta:               Anterior
 P. Cord Insertion:      Not well visualized

 Amniotic Fluid
 AFI FV:      Oligohydramnios secondary to ROM

 AFI Sum(cm)     %Tile       Largest Pocket(cm)
 2.59            < 3

 RUQ(cm)       RLQ(cm)       LUQ(cm)        LLQ(cm)
 0             0
Biometry

 BPD:      87.4  mm     G. Age:  35w 2d       > 99  %    CI:        80.02   %    70 - 86
                                                         FL/HC:      18.5   %    19.1 -
 HC:      308.7  mm     G. Age:  34w 3d         83  %    HC/AC:      1.17        0.96 -
 AC:      264.3  mm     G. Age:  30w 4d         18  %    FL/BPD:     65.4   %    71 - 87
 FL:       57.2  mm     G. Age:  30w 0d          6  %    FL/AC:      21.6   %    20 - 24
 HUM:      54.6  mm     G. Age:  31w 5d         52  %
 Est. FW:    2833  gm    3 lb 12 oz      44  %
Gestational Age

 Clinical EDD:  31w 5d                                        EDD:   02/24/19
 U/S Today:     32w 4d                                        EDD:   02/18/19
 Best:          31w 5d     Det. By:  Clinical EDD             EDD:   02/24/19
Anatomy

 Cranium:               Appears normal         Aortic Arch:            Not well visualized
 Cavum:                 Not well visualized    Ductal Arch:            Not well visualized
 Ventricles:            Not well visualized    Diaphragm:              Appears normal
 Choroid Plexus:        Not well visualized    Stomach:                Appears normal, left
                                                                       sided
 Cerebellum:            Not well visualized    Abdomen:                Appears normal
 Posterior Fossa:       Not well visualized    Abdominal Wall:         Not well visualized
 Nuchal Fold:           Not well visualized    Cord Vessels:           Appears normal (3
                                                                       vessel cord)
 Face:                  Not well visualized    Kidneys:                Not well visualized
 Lips:                  Not well visualized    Bladder:                Appears normal
 Palate:                Not well visualized    Spine:                  Not well visualized
 Heart:                 Appears normal         Upper Extremities:      Present
                        (4CH, axis, and
                        situs)
 RVOT:                  Not well visualized    Lower Extremities:      Present
 LVOT:                  Not well visualized

 Other:  Technically difficult due to low amniotic fluid.
Cervix Uterus Adnexa

 Cervix
 Not visualized (advanced GA >04wks)
Impression

  Patient was evaluated for c/o leakage of amniotic fluid and a
 diagnosis of PPROM was made on clinical examination.
 On ultrasound, oligohydramnios is seen. Fetal growth is
 appropriate for gestational age (EFW=1,700 grams). Fetal
 anatomy appears normal, but limited by advanced gestational
 age and oligohydramnios.
Recommendations

 -Weekly BPP till delivery.
                 Rapalo, [place]

## 2019-05-03 IMAGING — US US MFM FETAL BPP WO NON STRESS
1 series · 13 of 24 positions shown · non-contrast
Comparison: none

[Series 1: us mfm fetal bpp wo non stress · 24 acquisitions, 13 frames shown]
[im 1/24]
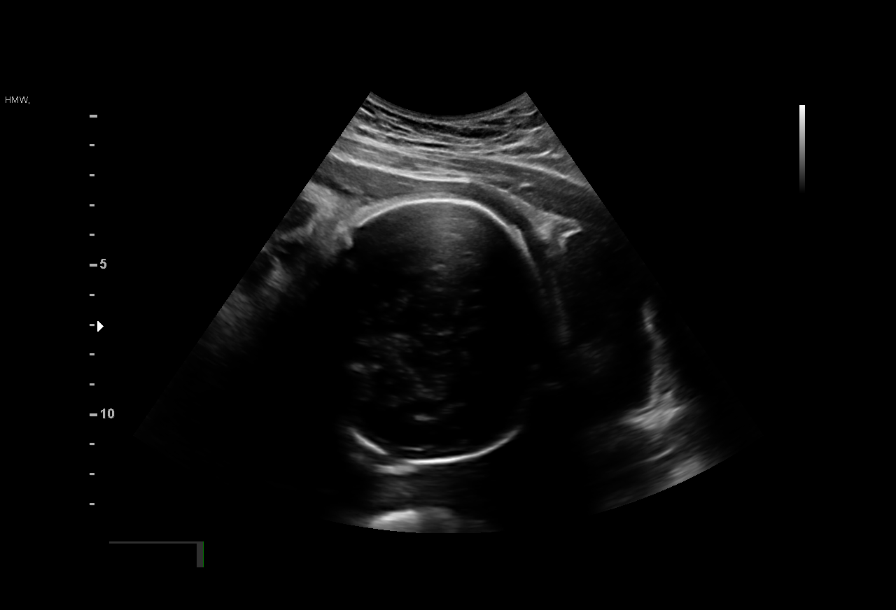
[im 3/24]
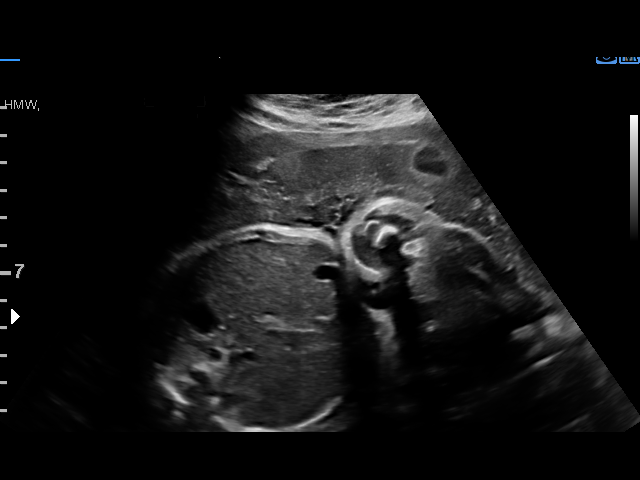
[im 5/24]
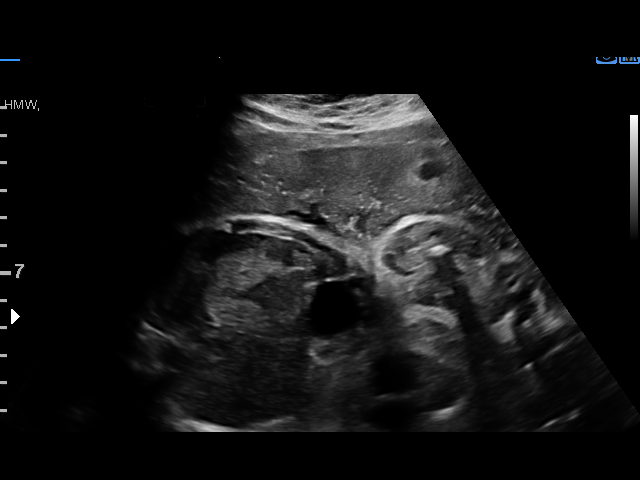
[im 7/24]
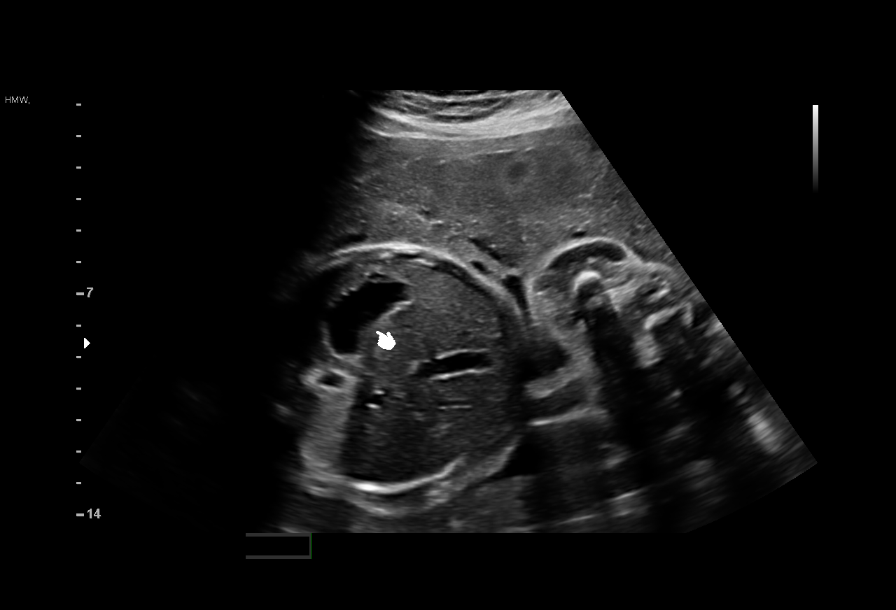
[im 9/24]
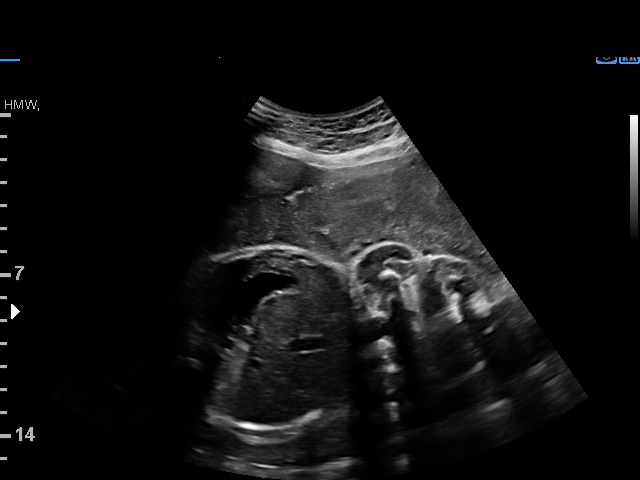
[im 11/24]
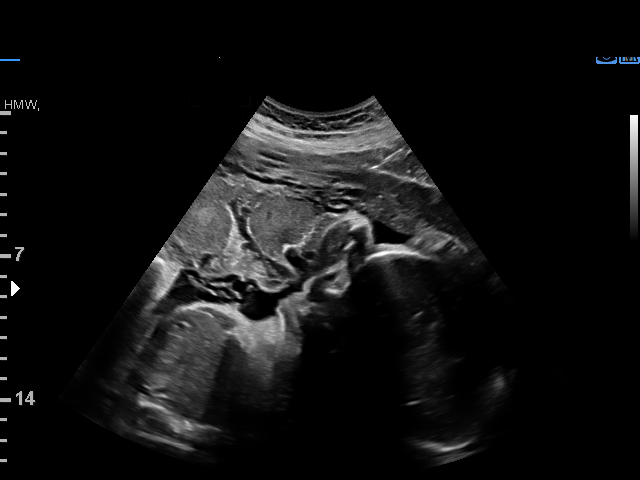
[im 13/24]
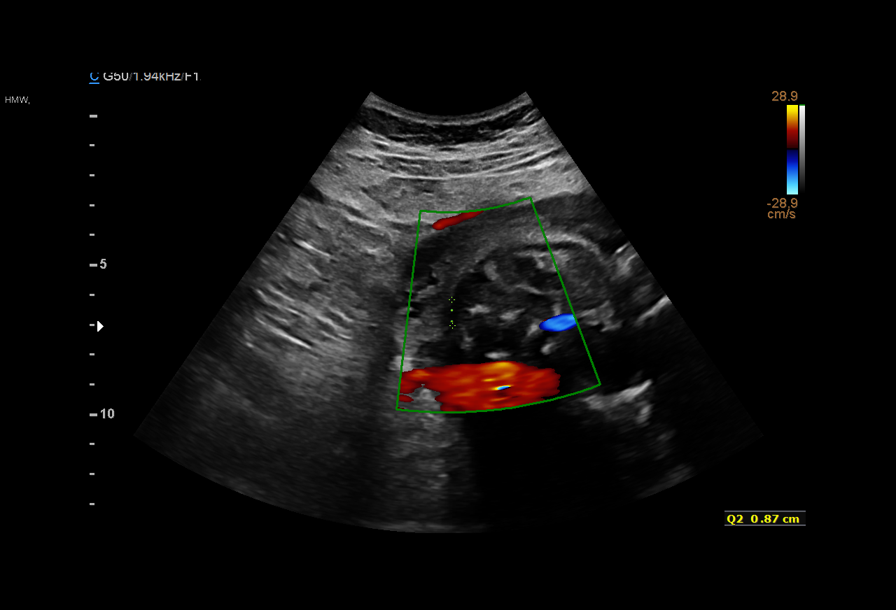
[im 14/24]
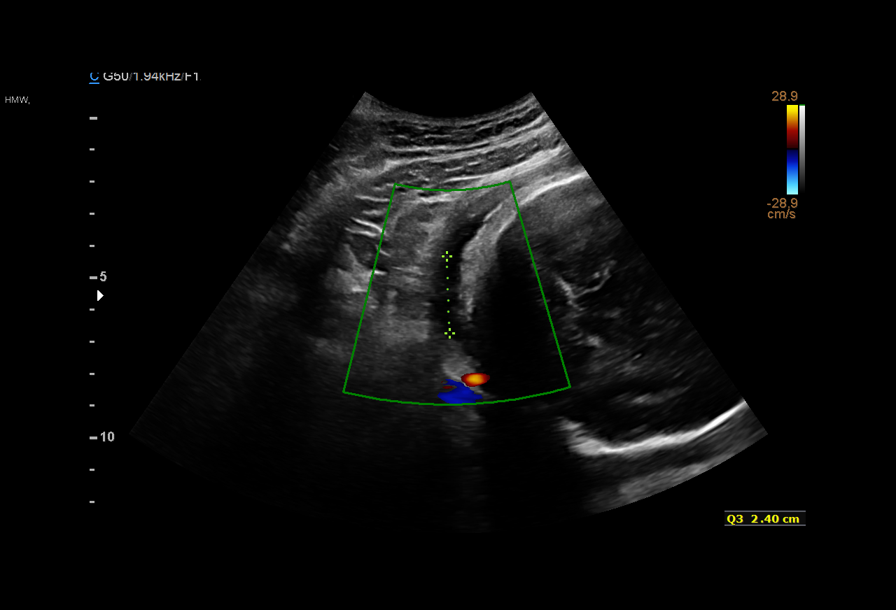
[im 16/24]
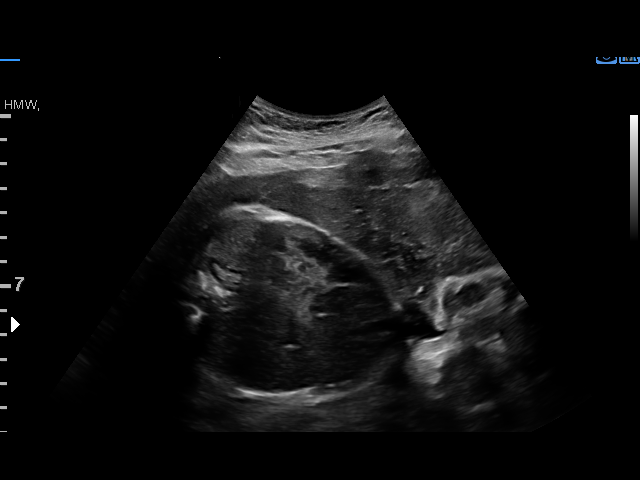
[im 18/24]
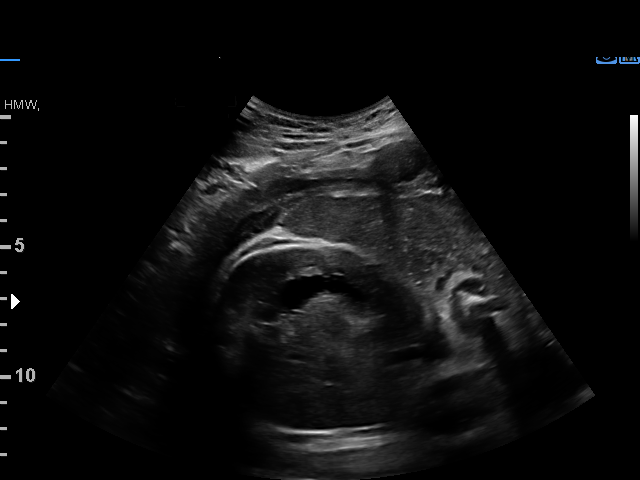
[im 20/24]
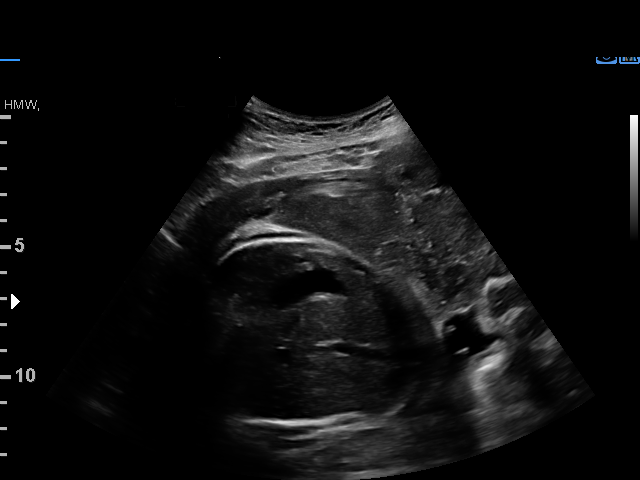
[im 22/24]
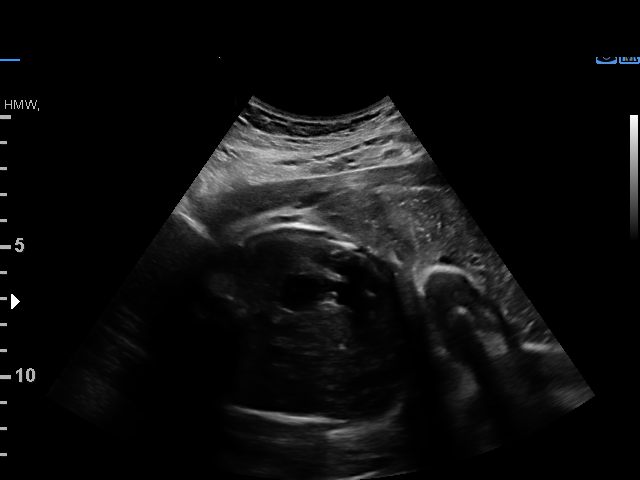
[im 24/24]
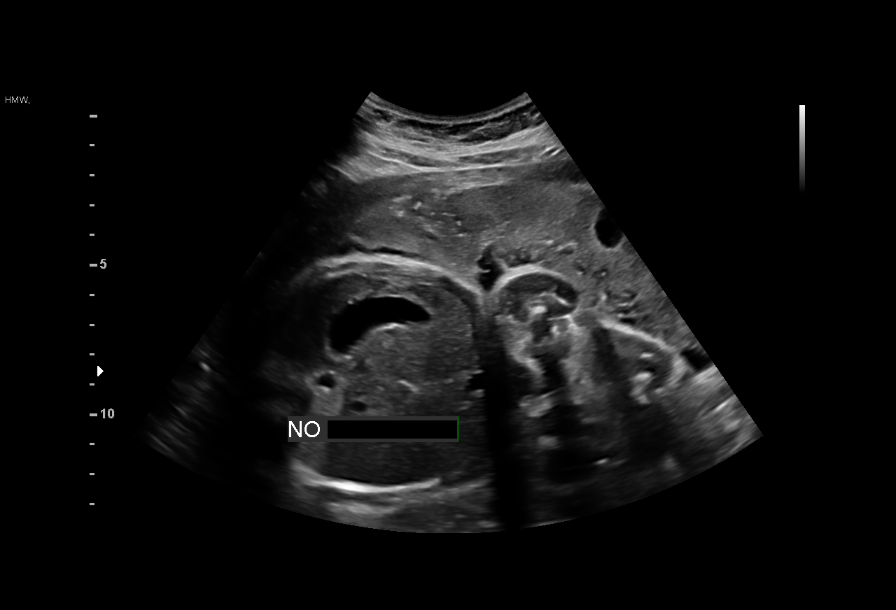

[13 of 24 positions shown; findings below may reference images not displayed]

Attending:        Karl Emil Tarby      Secondary Phy.:    [facility]
                                                             Care

 ----------------------------------------------------------------------

 ----------------------------------------------------------------------
Indications

  32 weeks gestation of pregnancy
  Premature rupture of membranes - leaking
  fluid
 ----------------------------------------------------------------------
Fetal Evaluation

 Num Of Fetuses:          1
 Fetal Heart Rate(bpm):   135
 Cardiac Activity:        Observed
 Presentation:            Cephalic

 Amniotic Fluid
 AFI FV:      Oligohydramnios

 AFI Sum(cm)     %Tile       Largest Pocket(cm)
 4.26            < 3

 RUQ(cm)       RLQ(cm)       LUQ(cm)        LLQ(cm)
 0.99          0

 Comment:    2x2 cm pocket of fluid visualized.
Biophysical Evaluation

 Amniotic F.V:   Pocket => 2 cm two         F. Tone:         Observed
                 planes
 F. Movement:    Observed                   Score:           [DATE]
 F. Breathing:   Not Observed
Gestational Age
 Clinical EDD:  32w 5d                                        EDD:   02/24/19
 Best:          32w 5d     Det. By:  Clinical EDD             EDD:   02/24/19
Anatomy

 Thoracic:              Appears normal         Bladder:                Appears normal
 Stomach:               Appears normal, left
                        sided
Impression

 Biophysical profile [DATE]
Recommendations

 Consider NST

## 2019-05-10 IMAGING — US US MFM FETAL BPP WO NON STRESS
1 series · 15 of 28 positions shown · non-contrast
Comparison: none

[Series 1: us mfm fetal bpp wo non stress · 32 acquisitions, 15 frames shown]
[im 1/32]
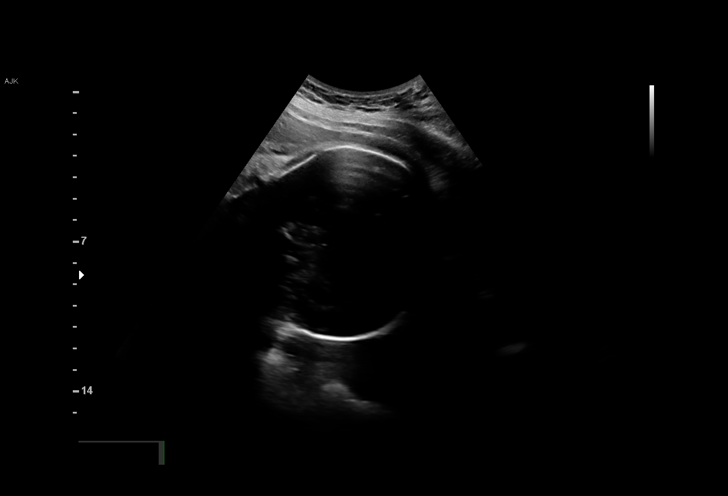
[im 3/32]
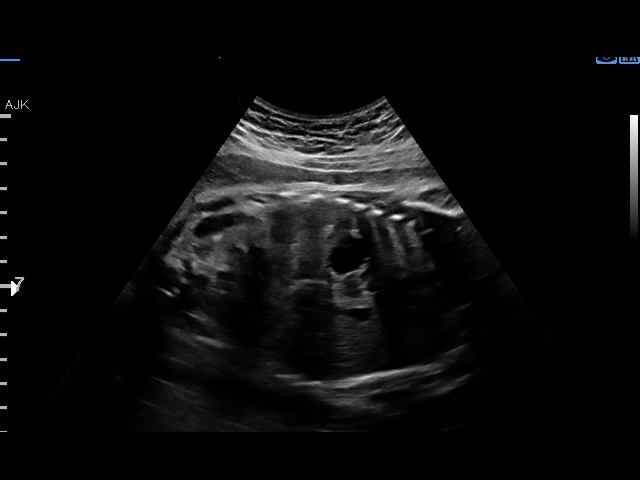
[im 5/32]
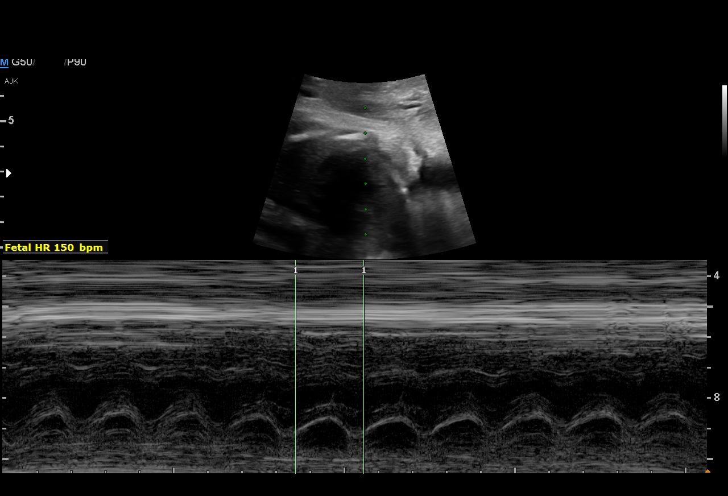
[im 7/32]
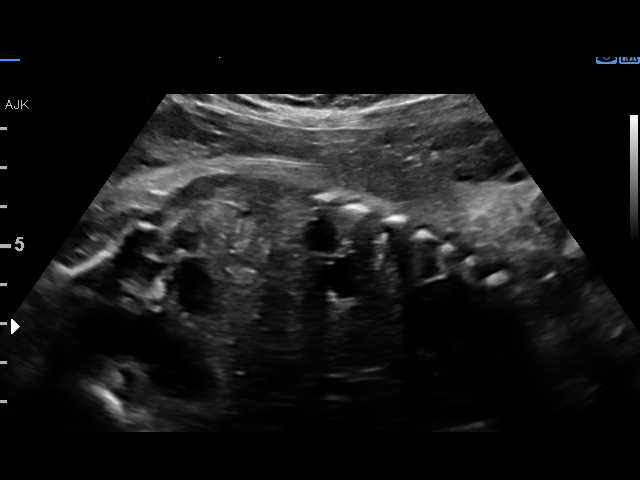
[im 10/32]
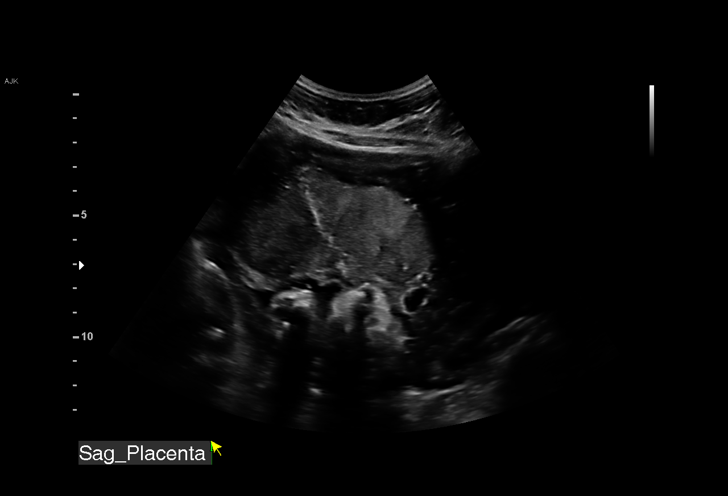
[im 12/32]
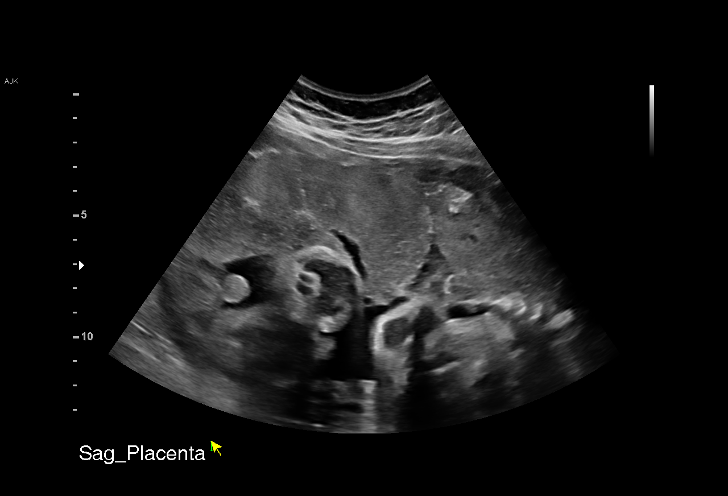
[im 14/32]
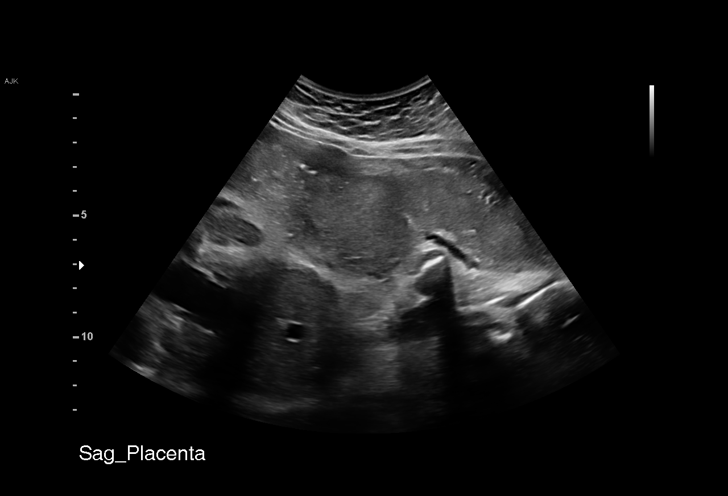
[im 17/32]
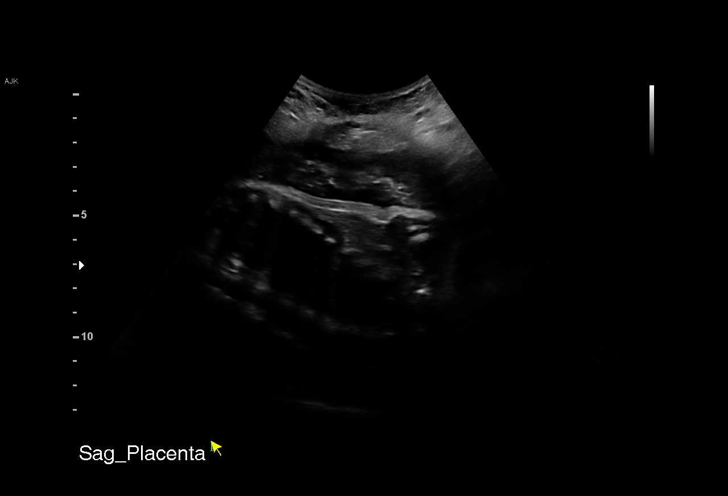
[im 18/32]
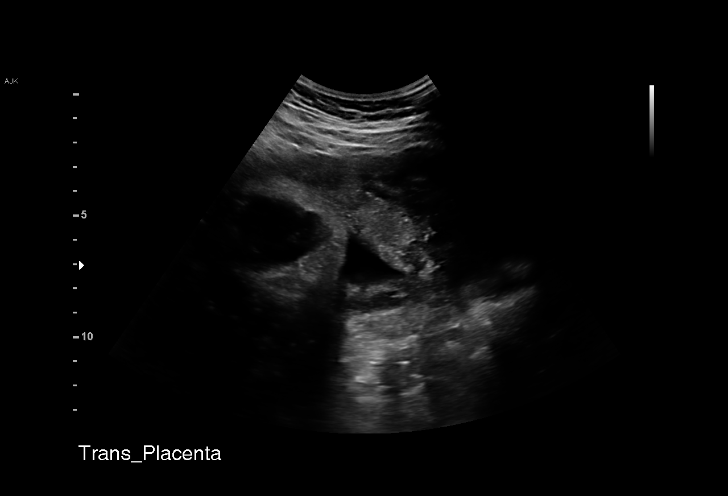
[im 20/32]
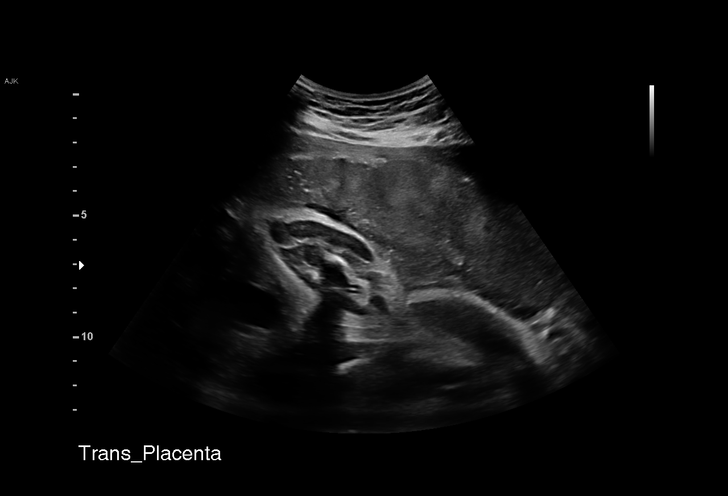
[im 22/32]
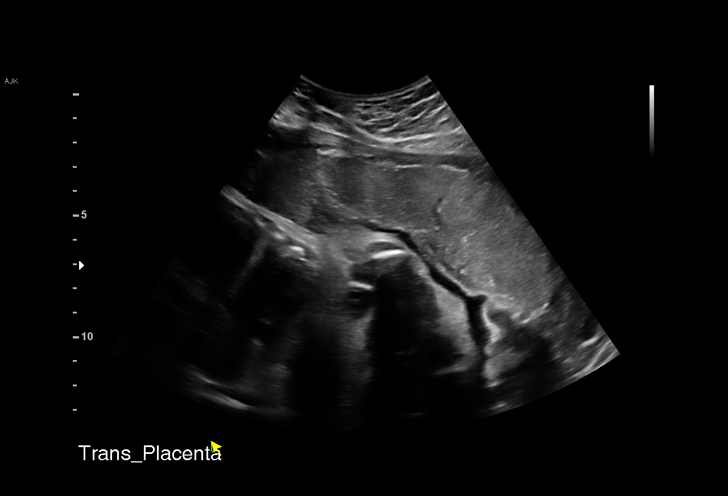
[im 25/32]
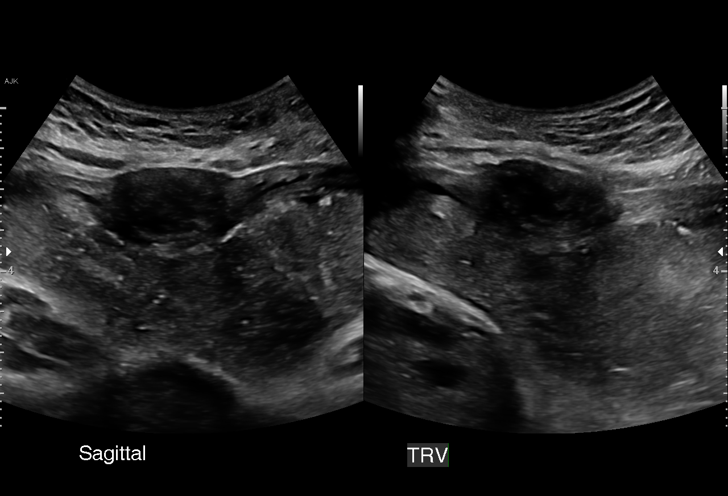
[im 27/32]
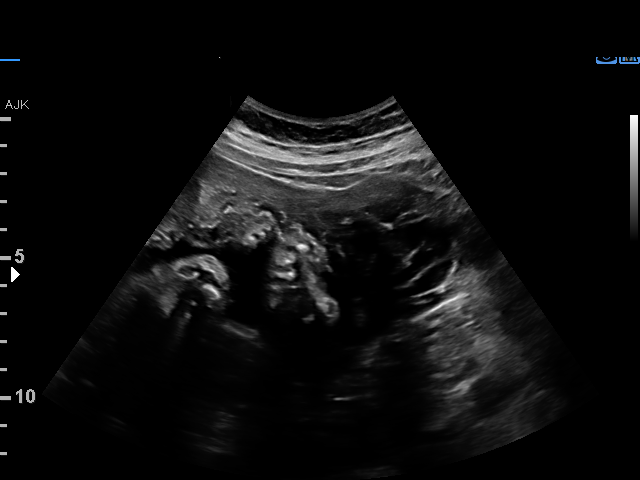
[im 29/32]
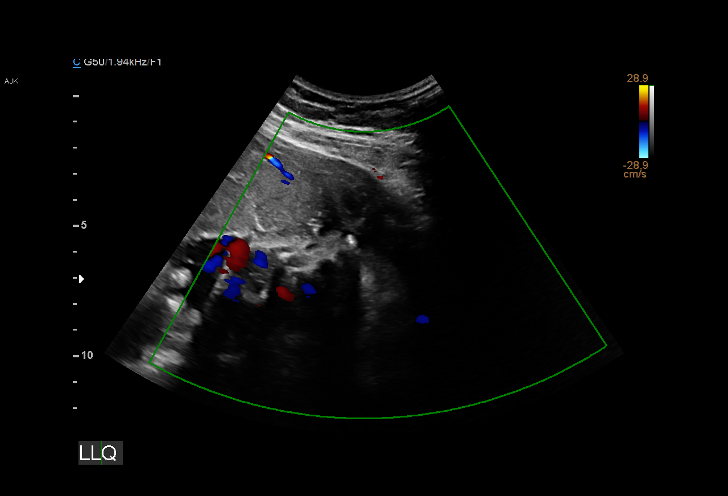
[im 32/32]
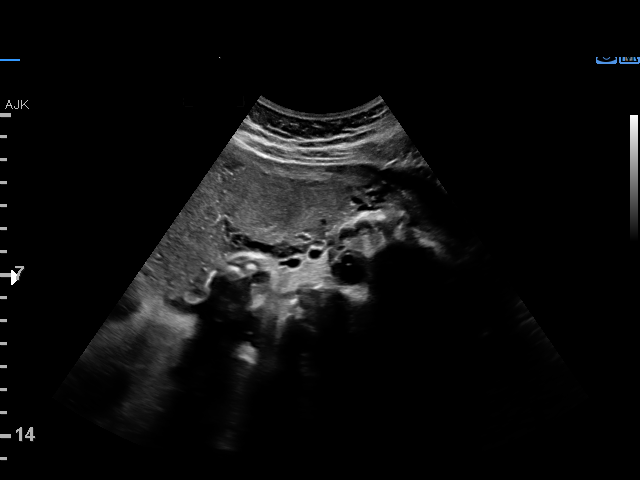

[15 of 28 positions shown; findings below may reference images not displayed]

Attending:        Udi Raudales        Secondary Phy.:    [facility]
                                                             Care

 ----------------------------------------------------------------------

 ----------------------------------------------------------------------
Indications

  Premature rupture of membranes - leaking
  fluid
  33 weeks gestation of pregnancy
 ----------------------------------------------------------------------
Fetal Evaluation

 Num Of Fetuses:          1
 Fetal Heart Rate(bpm):   150
 Cardiac Activity:        Observed
 Presentation:            Cephalic
 Placenta:                Anterior
 P. Cord Insertion:       Visualized

 Amniotic Fluid
 AFI FV:      Oligohydramnios

 AFI Sum(cm)     %Tile       Largest Pocket(cm)
 3.3             < 3

                             LUQ(cm)


 Comment:    Stomach and bladder noted.
Biophysical Evaluation

 Amniotic F.V:   Pocket => 2 cm two         F. Tone:         Observed
                 planes
 F. Movement:    Observed                   Score:           [DATE]
 F. Breathing:   Observed
Gestational Age

 Clinical EDD:  33w 5d                                        EDD:   02/24/19
 Best:          33w 5d     Det. By:  Clinical EDD             EDD:   02/24/19
Impression

 PPROM.
 Antenatal testing is reassuring. BPP [DATE].  Oligohydramnios is
 seen.
                 Gajewski, Nadege
